# Patient Record
Sex: Male | Born: 1945 | Race: White | Hispanic: No | Marital: Married | Smoking: Former smoker
Health system: Southern US, Community
[De-identification: ages and names within clinical notes are randomized; demographics above are authoritative.]

## PROBLEM LIST (undated history)

## (undated) DIAGNOSIS — M1990 Unspecified osteoarthritis, unspecified site: Secondary | ICD-10-CM

## (undated) DIAGNOSIS — Z87442 Personal history of urinary calculi: Secondary | ICD-10-CM

## (undated) DIAGNOSIS — J449 Chronic obstructive pulmonary disease, unspecified: Secondary | ICD-10-CM

## (undated) DIAGNOSIS — K219 Gastro-esophageal reflux disease without esophagitis: Secondary | ICD-10-CM

## (undated) DIAGNOSIS — J45909 Unspecified asthma, uncomplicated: Secondary | ICD-10-CM

## (undated) DIAGNOSIS — E785 Hyperlipidemia, unspecified: Secondary | ICD-10-CM

## (undated) DIAGNOSIS — R0600 Dyspnea, unspecified: Secondary | ICD-10-CM

## (undated) HISTORY — PX: HERNIA REPAIR: SHX51

## (undated) HISTORY — DX: Hyperlipidemia, unspecified: E78.5

## (undated) HISTORY — PX: JOINT REPLACEMENT: SHX530

## (undated) HISTORY — DX: Chronic obstructive pulmonary disease, unspecified: J44.9

## (undated) HISTORY — PX: OTHER SURGICAL HISTORY: SHX169

## (undated) HISTORY — DX: Dyspnea, unspecified: R06.00

---

## 2002-01-09 ENCOUNTER — Encounter: Payer: Self-pay | Admitting: Emergency Medicine

## 2002-01-09 ENCOUNTER — Emergency Department (HOSPITAL_COMMUNITY): Admission: EM | Admit: 2002-01-09 | Discharge: 2002-01-09 | Payer: Self-pay | Admitting: Emergency Medicine

## 2004-06-25 ENCOUNTER — Inpatient Hospital Stay (HOSPITAL_COMMUNITY): Admission: RE | Admit: 2004-06-25 | Discharge: 2004-06-28 | Payer: Self-pay | Admitting: Orthopedic Surgery

## 2004-07-09 ENCOUNTER — Ambulatory Visit (HOSPITAL_COMMUNITY): Admission: RE | Admit: 2004-07-09 | Discharge: 2004-07-09 | Payer: Self-pay | Admitting: Orthopedic Surgery

## 2007-01-12 ENCOUNTER — Ambulatory Visit: Payer: Self-pay | Admitting: Pulmonary Disease

## 2007-02-24 ENCOUNTER — Telehealth: Payer: Self-pay | Admitting: Pulmonary Disease

## 2007-03-01 ENCOUNTER — Ambulatory Visit: Payer: Self-pay | Admitting: Pulmonary Disease

## 2007-03-01 DIAGNOSIS — R0602 Shortness of breath: Secondary | ICD-10-CM | POA: Insufficient documentation

## 2007-03-01 DIAGNOSIS — E785 Hyperlipidemia, unspecified: Secondary | ICD-10-CM | POA: Insufficient documentation

## 2007-04-02 ENCOUNTER — Telehealth: Payer: Self-pay | Admitting: Pulmonary Disease

## 2008-05-08 ENCOUNTER — Ambulatory Visit: Payer: Self-pay | Admitting: Adult Health

## 2008-05-08 DIAGNOSIS — J4489 Other specified chronic obstructive pulmonary disease: Secondary | ICD-10-CM | POA: Insufficient documentation

## 2008-05-08 DIAGNOSIS — J449 Chronic obstructive pulmonary disease, unspecified: Secondary | ICD-10-CM | POA: Insufficient documentation

## 2008-07-12 ENCOUNTER — Ambulatory Visit: Payer: Self-pay | Admitting: Pulmonary Disease

## 2008-07-26 ENCOUNTER — Inpatient Hospital Stay (HOSPITAL_COMMUNITY): Admission: RE | Admit: 2008-07-26 | Discharge: 2008-07-30 | Payer: Self-pay | Admitting: Orthopedic Surgery

## 2009-03-05 ENCOUNTER — Ambulatory Visit: Payer: Self-pay | Admitting: Pulmonary Disease

## 2009-06-07 ENCOUNTER — Telehealth (INDEPENDENT_AMBULATORY_CARE_PROVIDER_SITE_OTHER): Payer: Self-pay | Admitting: *Deleted

## 2009-07-11 ENCOUNTER — Ambulatory Visit: Payer: Self-pay | Admitting: Pulmonary Disease

## 2009-08-30 ENCOUNTER — Telehealth (INDEPENDENT_AMBULATORY_CARE_PROVIDER_SITE_OTHER): Payer: Self-pay | Admitting: *Deleted

## 2009-10-04 ENCOUNTER — Telehealth (INDEPENDENT_AMBULATORY_CARE_PROVIDER_SITE_OTHER): Payer: Self-pay | Admitting: *Deleted

## 2010-01-09 ENCOUNTER — Telehealth (INDEPENDENT_AMBULATORY_CARE_PROVIDER_SITE_OTHER): Payer: Self-pay | Admitting: *Deleted

## 2010-03-20 ENCOUNTER — Telehealth (INDEPENDENT_AMBULATORY_CARE_PROVIDER_SITE_OTHER): Payer: Self-pay | Admitting: *Deleted

## 2010-04-11 NOTE — Progress Notes (Signed)
Summary: prescript-LMTCB x1   Phone Note Call from Patient   Caller: Patient Call For: clance Summary of Call: pt states advair is not working for him . would like to try advair hfa. [facility] [address] Initial call taken by: Rickard Patience,  August 30, 2009 2:39 PM  Follow-up for Phone Call        Spoke with pt.  Pt states Advair is "not doing the job."  requesting to try advair hfa.  Requesting rx by tomorrow.  Pt also requesting rescue inhaler refills-not needed at this time but would like at pharm for when he needs them.  Will forward message to KC-pls advise.  Thanks! Gweneth Dimitri RN  August 30, 2009 2:48 PM   Additional Follow-up for Phone Call Additional follow up Details #1::        ok to change to advair hfa 115/21  2 puffs am and pm...rinse mouth well #1, 6 fills. Additional Follow-up by: Barbaraann Share MD,  August 31, 2009 8:52 AM    Additional Follow-up for Phone Call Additional follow up Details #2::    LMOMTCB Vernie Murders  August 31, 2009 8:57 AM  Spoke with pt and advised okay per [facility] to change to advair hfa 2 puffs am and pm rinse mouth well.  Rx was sent to pharm. Follow-up by: Vernie Murders,  August 31, 2009 12:02 PM  New/Updated Medications: ADVAIR HFA 115-21 MCG/ACT AERO (FLUTICASONE-SALMETEROL) 2 puffs every 12 hours, rinse mouth well after each use Prescriptions: ADVAIR HFA 115-21 MCG/ACT AERO (FLUTICASONE-SALMETEROL) 2 puffs every 12 hours, rinse mouth well after each use  #1 x 6   Entered by:   Vernie Murders   Authorized by:   Barbaraann Share MD   Signed by:   Vernie Murders on 08/31/2009   Method used:   Electronically to        [facility] [phone]* (retail)       [address]       [place], [place]  [address]       Ph: [phone] or [phone]       Fax: [phone]   RxID:   [id number] PROAIR HFA 108 (90 BASE) MCG/ACT  AERS (ALBUTEROL SULFATE) 2 puffs every 4-6 hours as needed  #1 x 6   Entered by:   Gweneth Dimitri RN   Authorized by:   Barbaraann Share  MD   Signed by:   Gweneth Dimitri RN on 08/30/2009   Method used:   Electronically to        [facility] [phone]* (retail)       [address]       [place], [place]  [address]       Ph: [phone] or [phone]       Fax: [phone]   RxID:   [id number]

## 2010-04-11 NOTE — Progress Notes (Signed)
Summary: change inhaler----call back at 2pm today.  Phone Note Call from Patient Call back at [facility] Phone [phone]   Caller: Patient Call For: Joseph Bates Reason for Call: Refill Medication, Talk to Nurse Summary of Call: need to change his symbicort to Advair.  Insurance will no longer pay for Symbicort.  Discussed with KC last visit. [facility] Initial call taken by: Eugene Gavia,  June 07, 2009 2:57 PM  Follow-up for Phone Call        Nothing in last ov note stating KC is ok with this change.  Will forward to [facility] - pls advise if this change will be ok.  Thanks.  Gweneth Dimitri RN  June 07, 2009 3:05 PM   Additional Follow-up for Phone Call Additional follow up Details #1::        ok to change to advair 250/50 one in am and pm let him know he has to rinse very well with this med compared to symbicort. #1, 6 fills. Additional Follow-up by: Barbaraann Share MD,  June 07, 2009 4:56 PM    Additional Follow-up for Phone Call Additional follow up Details #2::    called pt's home number, LMOMTCB to inform pt KC is ok with this change and rx has been [facility].  Gweneth Dimitri RN  June 07, 2009 5:00 PM  ATC Pt at home #.  Was informed pt doesn't get home from work until 2:30 and to call then.  Will call back. Aundra Millet Reynolds LPN  June 09, 979 10:20 AM     Additional Follow-up for Phone Call Additional follow up Details #3:: Details for Additional Follow-up Action Taken: Spoke with pt and made aware that we are going to call in rx for advair 250/50, 1 puff two times a day and rinse mouth very well after each use.  Pt verbalized understanding.  [facility] [address] Additional Follow-up by: Vernie Murders,  June 08, 2009 2:36 PM  New/Updated Medications: ADVAIR DISKUS 250-50 MCG/DOSE AEPB (FLUTICASONE-SALMETEROL) 1 puff in am and 1 puff in pm. Prescriptions: ADVAIR DISKUS 250-50 MCG/DOSE AEPB (FLUTICASONE-SALMETEROL) 1 puff in am and 1 puff in pm.  #1 x 6  Entered by:   Gweneth Dimitri RN   Authorized by:   Barbaraann Share MD   Signed by:   Arman Filter LPN on 19/14/7829   Method used:   Electronically to        [facility] [phone]* (retail)       [address]       [place], [place]  [address]       Ph: [phone] or [phone]       Fax: [phone]   RxID:   [id number]

## 2010-04-11 NOTE — Progress Notes (Signed)
Summary: prescription change  Phone Note Call from Patient Call back at Home Phone [phone]   Caller: Patient Call For: clance Summary of Call: Wants advair hfa 115-53mcg changed to dulera hfa.//[facility] [address] Initial call taken by: Darletta Moll,  October 04, 2009 1:51 PM  Follow-up for Phone Call        Called, spoke with pt.  Pt states advair hfa is "still not solving my problems" like symbicort did but states he cannot afford the symbicort.  Would like to try dulera.  [facility] [address] [place], pls advise if ok to change advair hfa to dulera.   Follow-up by: Gweneth Dimitri RN,  October 04, 2009 2:07 PM  Additional Follow-up for Phone Call Additional follow up Details #1::        ok to change to dulera 100/5  2 inhalations am and pm. Additional Follow-up by: Barbaraann Share MD,  October 04, 2009 5:07 PM    New/Updated Medications: DULERA 100-5 MCG/ACT AERO (MOMETASONE FURO-FORMOTEROL FUM) 2 puffs am and pm Prescriptions: DULERA 100-5 MCG/ACT AERO (MOMETASONE FURO-FORMOTEROL FUM) 2 puffs am and pm  #1 x 5   Entered by:   Vernie Murders   Authorized by:   Barbaraann Share MD   Signed by:   Vernie Murders on 10/04/2009   Method used:   Electronically to        [facility] [phone]* (retail)       [address]       [place], [place]  [address]       Ph: [phone] or [phone]       Fax: [phone]   RxID:   [id number]   Appended Document: prescription change Rx was sent for dulera, pt aware

## 2010-04-11 NOTE — Assessment & Plan Note (Signed)
Summary: rov for mild emphysema   Primary Provider/Referring Provider:  Deboraha Sprang   CC:  Pt is here for a f/u appt.  Pt states breathing is "slightly worse" since being on Advair.  Pt states he is just getting over a "chest cold."  .  History of Present Illness: The pt comes in today for f/u of his known mild emphysema.  He has been on symbicort in the past with good results, but then had to be changed to advair due to coverage change at his insurance company.  He feels that his breathing is not quite as good on this med, and the dry powder is somewhat bothersome at times.  He has not had any recent flareup, but is just getting over what sounds like a URI.    Medications Prior to Update: 1)  Advair Diskus 250-50 Mcg/dose Aepb (Fluticasone-Salmeterol) .Marland Kitchen.. 1 Puff in Am and 1 Puff in Pm. 2)  Proair Hfa 108 (90 Base) Mcg/act  Aers (Albuterol Sulfate) .... 2 Puffs Every 4-6 Hours As Needed  Allergies (verified): 1)  ! Aleve (Naproxen Sodium)  Review of Systems      See HPI  Vital Signs:  Patient profile:   65 year old male Height:      69 inches Weight:      244 pounds O2 Sat:      97 % on Room air Temp:     98.6 degrees F oral Pulse rate:   63 / minute BP sitting:   136 / 90  (right arm) Cuff size:   regular  Vitals Entered By: Arman Filter LPN (Jul 11, 8117 3:08 PM)  O2 Flow:  Room air CC: Pt is here for a f/u appt.  Pt states breathing is "slightly worse" since being on Advair.  Pt states he is just getting over a "chest cold."   Comments Medications reviewed with patient Arman Filter LPN  Jul 11, 1476 3:08 PM    Physical Exam  General:  ow male in nad Lungs:  clear to auscultation Heart:  rrr Extremities:  no edema noted.   Impression & Recommendations:  Problem # 1:  COPD (ICD-496) the pt is not doing as well on advair as on symbicort, but will see how things go over the next few mos due to expense.  I have asked him to stay active, and to f/u with me in one  year.  Other Orders: Est. Patient Level II (29562)  Patient Instructions: 1)  no change in meds currently 2)  let me know if you would like to consider a different maintenance inhaler. 3)  followup with me in a year.  Wil have schedulers cancel all old apptms.

## 2010-04-11 NOTE — Progress Notes (Signed)
Summary: medication issue - dulera not working  Phone Note Call from Patient Call back at [facility] [phone]   Caller: Patient Call For: clance Summary of Call: Pt has been using dulera 100-18mcg for the past 2 months, says its not working, pls advise.//[facility] [address] Initial call taken by: Darletta Moll,  January 09, 2010 2:20 PM  Follow-up for Phone Call        called spoke with patient who states that he has been having difficulty breathing when lying down and increased use of his rescue inhaler.  states that he sometimes wakes up during the night and "can't breathe" but this is helped when he takes his rescue before bedtime.  does not feel that the dulera helps his breathing, though he does admit that it helps more than the advair did.  please advise, thanks! Follow-up by: Boone Master CNA/MA,  January 09, 2010 3:45 PM  Additional Follow-up for Phone Call Additional follow up Details #1::        let pt know that dulera is a very good med, and makes me wonder if his symptoms at night are really due to his lung issue or possibly sleep apnea?  we can try to see if insurance will give preauth to let you get synbicort again, but the other option is for him to have a sleep study....we have already discussed this.  See what he wants to do.  If he wants to do the preauth thing, please get necessary paperwork Additional Follow-up by: Barbaraann Share MD,  January 09, 2010 4:38 PM    Additional Follow-up for Phone Call Additional follow up Details #2::    called spoke with patient.  advised of KC's recs as stated above.  pt stated that he does not feel this is a sleep issue as he "neglected to tell" me that this also occurs during the day - but in a supine position such as lying back in a recliner or lying back in his car during lunch.  i asked pt if he sleeps during these times, for which he denied.  pt would like to try to see if insurance will pay for the symbicort rather than do the  sleep study.  informed pt that this process may take up to 2 weeks, pt okay with this.   Boone Master CNA/MA  January 09, 2010 5:29 PM   called [facility] [address] to ask if another script for the symbicort needs to be sent in order to receive a denial from insurance and then do the PA.  per pharmacist, symbicort does not require a PA but is in a higher tier.  will cost pt $60 (she was unsure if this was a copay, or a deductible meaning that once that deductible is met the price will decrease).  pharmacist stated that pt must advair, dulera and asmanex for coverage.  will call pt to see if he is able to afford the $60.  LMOM TCB x1. Boone Master CNA/MA  January 10, 2010 9:11 AM   pt returned my call.  informed him of what the pharmacist told me.  pt stated that he can pay the $60 for the symbicort and would prefer to just go ahead and restart that rather than try another inhaler.  KC may i go ahead and send refills to pt's pharmacy? Boone Master CNA/MA  January 10, 2010 2:22 PM   Additional Follow-up for Phone Call Additional follow up Details #  3:: Details for Additional Follow-up Action Taken: bet he does fall asleep in chair and on sofa during day. ok with me to go back to symbicort. Additional Follow-up by: Barbaraann Share MD,  January 10, 2010 5:09 PM  New/Updated Medications: SYMBICORT 160-4.5 MCG/ACT AERO (BUDESONIDE-FORMOTEROL FUMARATE) 2 puffs two times a day Prescriptions: SYMBICORT 160-4.5 MCG/ACT AERO (BUDESONIDE-FORMOTEROL FUMARATE) 2 puffs two times a day  #1 x 5   Entered by:   Vernie Murders   Authorized by:   Barbaraann Share MD   Signed by:   Vernie Murders on 01/10/2010   Method used:   [facility]ronically to        [facility] [phone]* (retail)       [address]       [place], [place]  [address]       Ph: [phone] or [phone]       Fax: [phone]   RxID:   [id number]

## 2010-04-11 NOTE — Progress Notes (Signed)
Summary: symbicort  Phone Note Call from Patient Call back at Home Phone [phone]   Caller: Patient Call For: clance Reason for Call: Talk to Nurse Summary of Call: Patient calling with questions and concerns about symbicort.   Initial call taken by: Lehman Prom,  March 20, 2010 2:13 PM  Follow-up for Phone Call        called spoke with patient.  he states insurance will not pay for symbicort and wonders what can be done now.  pt states that his brother uses spiriva.  i informed pt that spiriva is not the same as symbicort and KC would have to make that decision.  pt would like to continue the symbicort if he can.  he states the discount card he was given in Nov worked well; he got the 1st for free and the 2nd with a low copay, but when he tried to refill for this month was told insurance will not pay.  per last phone note, pharmacy stated that symbicort does not need a copay but is a higher tier.  will call pharmacy again to find out what's going on.  called [facility], spoke with jennifer who states that with the new year pt's insurance is requiring a cheaper inhaler or for pt to go through a mail-order.  (cheaper inhalers include dulera, adviar hfa and diskus, qvar, alvesco and asmanex).  jennifer unable to tell me pt's copay through mail-order, pt must call customer service # on back of his card. Boone Master CNA/MA  March 20, 2010 4:31 PM   Additional Follow-up for Phone Call Additional follow up Details #1::        called spoke with patient and informed him of my findings.  pt states that he prefers not going thru a mail-order to avoid paying a 43month copay all at once.  would like to try one of the "cheaper" inhalers as stated by [facility].  per pt chart dulera, advair hfa and diskus did not work.  this leaves qvar, alvesco and asmanex.  KC please advise if pt may try one of these inhalers.  pt okay with call back tomorrow. Boone Master CNA/MA  March 20, 2010 4:55 PM        Additional Follow-up for Phone Call Additional follow up Details #2::    we can change him to dulera 100/5 2 puffs am and pm if this is a cheaper alternative for him. Follow-up by: Barbaraann Share MD,  March 20, 2010 5:08 PM  Additional Follow-up for Phone Call Additional follow up Details #3:: Details for Additional Follow-up Action Taken: sorry, [facility]  pt has tried this inhaler with no relief.  please advise. Boone Master CNA/MA  March 20, 2010 5:16 PM   there are no other choices for inhalers that are equivently to symbicort except dulera, advair.  He either needs to pay the higher copay, retry one of the alternatives, or go to a lesser medication that may not do as well.  Spoek with pt and notified of the above recs per [facility].  Pt verbalized understanding and states that he will try dulera again. Rx was sent to pharm Vernie Murders  March 21, 2010 2:03 PM  Additional Follow-up by: Barbaraann Share MD,  March 21, 2010 9:58 AM  New/Updated Medications: DULERA 100-5 MCG/ACT AERO (MOMETASONE FURO-FORMOTEROL FUM) 2 puffs two times a day Prescriptions: DULERA 100-5 MCG/ACT AERO (MOMETASONE FURO-FORMOTEROL FUM) 2 puffs two times a day  #1 x 5   Entered  by:   Vernie Murders   Authorized by:   Barbaraann Share MD   Signed by:   Vernie Murders on 03/21/2010   Method used:   [facility]ronically to        [facility] [phone]* (retail)       [address]       [place], [place]  [address]       Ph: [phone] or [phone]       Fax: [phone]   RxID:   [id number]

## 2010-06-18 LAB — URINALYSIS, ROUTINE W REFLEX MICROSCOPIC
Bilirubin Urine: NEGATIVE
Ketones, ur: NEGATIVE mg/dL
Nitrite: NEGATIVE
Specific Gravity, Urine: 1.021 (ref 1.005–1.030)
Urobilinogen, UA: 0.2 mg/dL (ref 0.0–1.0)

## 2010-06-18 LAB — DIFFERENTIAL
Basophils Relative: 1 % (ref 0–1)
Lymphocytes Relative: 19 % (ref 12–46)
Monocytes Absolute: 0.4 10*3/uL (ref 0.1–1.0)
Monocytes Relative: 6 % (ref 3–12)
Neutro Abs: 4.4 10*3/uL (ref 1.7–7.7)
Neutrophils Relative %: 70 % (ref 43–77)

## 2010-06-18 LAB — CBC
HCT: 33.7 % — ABNORMAL LOW (ref 39.0–52.0)
HCT: 34.8 % — ABNORMAL LOW (ref 39.0–52.0)
HCT: 46.4 % (ref 39.0–52.0)
Hemoglobin: 12.1 g/dL — ABNORMAL LOW (ref 13.0–17.0)
Hemoglobin: 13.2 g/dL (ref 13.0–17.0)
Hemoglobin: 15.8 g/dL (ref 13.0–17.0)
MCHC: 34 g/dL (ref 30.0–36.0)
MCV: 87.8 fL (ref 78.0–100.0)
MCV: 87.8 fL (ref 78.0–100.0)
Platelets: 163 10*3/uL (ref 150–400)
RBC: 3.84 MIL/uL — ABNORMAL LOW (ref 4.22–5.81)
RBC: 4.38 MIL/uL (ref 4.22–5.81)
RBC: 4.38 MIL/uL (ref 4.22–5.81)
RBC: 5.28 MIL/uL (ref 4.22–5.81)
RDW: 12.8 % (ref 11.5–15.5)
WBC: 6.3 10*3/uL (ref 4.0–10.5)
WBC: 6.9 10*3/uL (ref 4.0–10.5)
WBC: 7.2 10*3/uL (ref 4.0–10.5)
WBC: 8.5 10*3/uL (ref 4.0–10.5)
WBC: 9.7 10*3/uL (ref 4.0–10.5)

## 2010-06-18 LAB — TYPE AND SCREEN: Antibody Screen: NEGATIVE

## 2010-06-18 LAB — COMPREHENSIVE METABOLIC PANEL
ALT: 22 U/L (ref 0–53)
AST: 22 U/L (ref 0–37)
Albumin: 3.7 g/dL (ref 3.5–5.2)
Alkaline Phosphatase: 63 U/L (ref 39–117)
BUN: 9 mg/dL (ref 6–23)
CO2: 23 mEq/L (ref 19–32)
Calcium: 9.2 mg/dL (ref 8.4–10.5)
Chloride: 112 mEq/L (ref 96–112)
Creatinine, Ser: 1.22 mg/dL (ref 0.4–1.5)
GFR calc Af Amer: >60 mL/min (ref >60)
GFR calc non Af Amer: >60 mL/min (ref >60)
Glucose, Bld: 91 mg/dL (ref 70–99)
Potassium: 4.4 mEq/L (ref 3.5–5.1)
Sodium: 141 mEq/L (ref 135–145)
Total Bilirubin: 1.3 mg/dL — ABNORMAL HIGH (ref 0.3–1.2)
Total Protein: 6.5 g/dL (ref 6.0–8.3)

## 2010-06-18 LAB — PROTIME-INR
INR: 1 (ref 0.00–1.49)
INR: 1.2 (ref 0.00–1.49)
INR: 1.4 (ref 0.00–1.49)
INR: 1.7 — ABNORMAL HIGH (ref 0.00–1.49)
Prothrombin Time: 15.2 seconds (ref 11.6–15.2)
Prothrombin Time: 17.8 seconds — ABNORMAL HIGH (ref 11.6–15.2)
Prothrombin Time: 28.8 seconds — ABNORMAL HIGH (ref 11.6–15.2)

## 2010-06-18 LAB — BASIC METABOLIC PANEL
Calcium: 7.9 mg/dL — ABNORMAL LOW (ref 8.4–10.5)
Chloride: 103 mEq/L (ref 96–112)
Creatinine, Ser: 1.07 mg/dL (ref 0.4–1.5)
GFR calc non Af Amer: >60 mL/min (ref >60)
Potassium: 3.8 mEq/L (ref 3.5–5.1)
Sodium: 137 mEq/L (ref 135–145)

## 2010-06-18 LAB — APTT: aPTT: 28 seconds (ref 24–37)

## 2010-07-23 NOTE — Op Note (Signed)
NAME:  Joseph Bates, Joseph Bates                 ACCOUNT NO.:  [id number]   MEDICAL RECORD NO.:  [id number]          PATIENT TYPE:  INP   LOCATION:  2550                         FACILITY:  [facility]   PHYSICIAN:  Harvie Junior, M.D.   DATE OF BIRTH:  1945/05/04   DATE OF PROCEDURE:  07/26/2008  DATE OF DISCHARGE:                               OPERATIVE REPORT   PREOPERATIVE DIAGNOSIS:  End-stage degenerative joint disease, left  knee.   POSTOPERATIVE DIAGNOSIS:  End-stage degenerative joint disease, left  knee.   PROCEDURES:  1. Left total knee replacement with a Sigma system, size 5 femur, size      5 tibia, 10-mm bridging bearing, and a 38-mm all-poly patella.  2. Left computer-assisted total knee replacement.   SURGEON:  Harvie Junior, MD   ASSISTANT:  Marshia Ly, PA   ANESTHESIA:  General.   BRIEF HISTORY:  Mr. Ullom is a 65 year old male with a long history of  having significant bilateral disease of the knee.  He was treated with a  total knee replacement on the right side some years ago and had done  reasonably well with that.  Because of the continued complaints of pain  on the left side, the patient was ultimately evaluated and felt to need  a total knee replacement.  He has failed conservative care.  Because of  his young age and significant varus malalignment and rotational  alignment of the femur, we felt that computer-assisted alignment was  going to be critical and the intramedullary alignment of the femur was  going to be difficult because of the curve of the femur but was  ultimately felt that computer-assisted total knee replacement would be  necessary and he is brought to the operating room for this procedure.   PROCEDURE IN DETAIL:  The patient was brought to the operating room.  After adequate anesthesia was obtained with general anesthetic, the  patient was placed supine on the operating table.  Left leg was prepped  and draped in the usual sterile fashion.   Following this, the leg was  exsanguinated and blood pressure tourniquet inflated to 350 mmHg.  Following this, a midline incision was made and subcutaneous tissue were  dissected down to the level of the extensor mechanism.  Medial  parapatellar arthrotomy was undertaken and following this medial and  lateral menisci were removed as well as anterior and posterior cruciates  with retropatellar fat pad.  The computer assistance module was then  placed.  Two pins in the tibia and two pins in the femur.  The  registration process was undertaken and that was about 30 minutes to  surgical procedure.  Once this was completed, the long alignment was  evaluated and the tibia was then cut perpendicular to the long axis  under computer assistance.  Once that was completed, attention was  turned towards the femur where the femur was cut perpendicular to an  anatomic axis.  Once that was done, spacer blocks were put in place,  little bit tight medially.  A little medial release was undertaken at  this  point and got excellent neutral long alignment at this point.  Once  this was completed, attention was turned towards the femoral side where  the femur was sized to a size 5 and the anterior and posterior cuts were  made as well as chamfers.  Once that was completed, attention was turned  towards the box-cut which was made centrally on the femur.  Once this  was completed, attention was turned towards the posterior femur where  posterior osteophytes were removed.  Attention was turned back to the  tibia where the tibia was sized to a 5, it was drilled and keeled.  Once  this was completed, the patella was sized and cut down to a level of 14  mm.  A 38-mm paddle was chosen.  Lugs were drilled in the femur and then  the patella.  At this point, trial components were placed and the knee  was put into neutral long alignment.  It had perfect neutral long  alignment and gap balance.  At this point, the  attention was turned  towards removal of trial components and the final components were then  cemented in place, size 5 femur, size 5 tibia, 10-mm bridging bearing,  and 38-mm all-poly patella.  These were hammered into place and all  excess cement was removed with the bone cement tool.  Once this was  completed, the 10 trial was placed until the cement had hardened and the  trial was removed.  Attention was then turned to the back of the knee  where all bleeding was controlled with electrocautery as well as lateral  and medially on the knee.  Once this was completed, a medium Hemovac  drain was placed.  At this point, the medial parapatellar trial was  closed with 1 Vicryl running, the skin with 0 and 2-0 Vicryl and skin  staples.  Sterile compressive dressing was applied and the patient was  taken to the recovery room and was noted to be under satisfactory  condition.  The estimated blood loss for the procedure was less than 25  mL.      Harvie Junior, M.D.  Electronically Signed     JLG/MEDQ  D:  07/26/2008  T:  07/27/2008  Job:  [id number]

## 2010-07-26 NOTE — Op Note (Signed)
NAME:  Joseph Bates, Joseph Bates                 ACCOUNT NO.:  [id number]   MEDICAL RECORD NO.:  [id number]          PATIENT TYPE:  INP   LOCATION:  2550                         FACILITY:  [facility]   PHYSICIAN:  Deidre Ala, M.D.    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   PROCEDURES:  Right total knee arthroplasty using cemented Depuy components,  rotating platform, LCS type.   SURGEON:  1.  Charlesetta Shanks, M.D.   ASSISTANT:  Clarene Reamer, P.A.-C.   ANESTHESIA:  General endotracheal.   CULTURES:  None.   DRAINS:  Two medium Hemovac to Autovac.   ESTIMATED BLOOD LOSS:  100 mL.   REPLACED:  Without.   TOURNIQUET TIME:  One hour and 20 minutes.   PATHOLOGICAL FINDINGS AND HISTORY:  Logon presented with severe right knee  pain, walking on a crutch.  He is employed by [employer]  and has been standing on concrete.  His x-rays revealed bone on bone.  He  had some posterior knee pain.  He was obviously end-stage DJD with cystic  changes in the medial femoral condyle.  It was felt that no other treatment  would be efficacious other than a knee replacement.  He desired to proceed.  At surgery, he had severe end-stage DJD tricompartment with osteophytes and  eburnated medial bone.  We ended sizing him to a large right femur, 12.5 mm  rotating platform, a #4 tibial tray and a 38 mm all poly patellar button,  replacing 9 mm.  We got him with full extension with flexion to about 110.  No anteroposterior instability with flexion.  The patella tracked well.  We  did do a partial medial release and good ligament balance ultimately.  Overall alignment was excellent with about 4 degrees of valgus.   DESCRIPTION OF  PROCEDURE:  With adequate anesthesia obtained using  endotracheal technique, 1 g of Ancef was given IV prophylaxis and another  one at tourniquet let down.  The patient was placed in the supine position.  The right lower extremity was prepped from toes to the tourniquet in a  standard fashion.  After standard prepping and draping, Esmarch  exsanguination was used and the tourniquet was let up to 350 mmHg.  A medial  parapatellar skin incision was followed by medial parapatellar retinacular  incision.  We then everted the patella, excising the fat pad, all of the  osteophytes, the menisci and the cruciates.  We then amputated the tibial  spine.  I then brought in the tibial alignment jig in place, pacing in very  slight varus to effect knee valgus, set it and made our initial cut.  I felt  he was going to be tight and active, needed more rather than less poly, so I  cut 5 mm more.  We then sized  him to a large for the femur, placed the  anterior cutting jig in place, placed it on the intramedullary guide and  then set the C clamp at 17.5, set it, pinned it and then made our  anteroposterior cuts, fitting actually at this point a 15.  Of course  ultimately a 12.5 was a better fit.  In any case, we did make those  anteroposterior cuts.  We then placed a 4 degree valgus cutting jig in  place.  We set it and made a cut actually 2.5 mg more to accommodate the 15  in extension.  We then had some tightness medially, did a medial release on  the ligament mostly on the tibial side and then placed the 15 in flexion and  extension.  The anteroposterior Chamfer cutting and notch cutting jig was  then put in place and those cuts made.  There was no need to make a far  posterior cut.  We then exposed the proximal tibia, sized to a 4, place a  central keel broach and then drilled after drilling for the central peg.  We  then placed the trial #4 tibia in place, trialed it with a 15 with the large  femur and  felt that the knee was too tight in extension and flexion, so  sized down to a 12.5.  Had perfect extension and perfect flexion.  It was a  little bit tight laterally, so I did a slight release on the lateral side of  the tibia.  Then we perfectly balanced ligamentously with a 12.5 in full  extension and flexion with no anteroposterior give and flexion to about 110.  No varus valgus instability.  We then callipered the patella, cutting down  from a 23 to a 14 to accommodate a 9 for 38 patellar button, put the  template on, cut three holes for the patellar pegs and then articulated the  patella through a range of motion trial.  All trial components were then  removed as the knee was thoroughly jet lavaged.  We then brought the  components on the field and checked for sizing as the cement was mixed with  Zinacef in a cement gun.  The knee was then flexed and the tibia exposed.  We then cemented on the tibial component with buttering of cement all the  way to the tip of the central PEG, impacted it and removed excess cement.  We then placed the rotating platform 12.5 size 4.  We then cemented on the  femoral component, impacted it, removed excess cement, held the knee in full  extension, further impacted it, removed excess cement and held it in about  30-40 degrees of flexion while the cement cured.  We then cemented on the  patellar component, impacted it, removed excess cement and held it with a  clamp until the cement had cured.  When the cement had cured, the tourniquet  was let down.  Bleeding points were cauterized.  Additional irrigation had  been carried with jet lavage prior to this with remaining mL in the bag.  We  then placed Hemovac drains in the medial and lateral gutters and brought out  the superolateral portal.  The wound was then closed in layers with 1-0  Vicryl on the retinaculum with a #1 locking PDS oversewn, 2-0 Vicryl on the subcutaneous and skin staples.  Hemovac was  hooked back to Autovac.  A bulky  sterile compressive dressing was applied with knee immobilizer.  The  patient  had a femoral nerve block.  He was awaken and taken to the recovery room in  satisfactory condition where he will start routine analgesia and  postoperative CPM.      VEP/MEDQ  D:  06/25/2004  T:  06/25/2004  Job:  [id number]

## 2010-07-26 NOTE — Discharge Summary (Signed)
NAME:  Joseph Bates, Joseph Bates                 ACCOUNT NO.:  [id number]   MEDICAL RECORD NO.:  [id number]          PATIENT TYPE:  INP   LOCATION:  0454                         FACILITY:  [facility]   PHYSICIAN:  Harvie Junior, M.D.   DATE OF BIRTH:  [birth year]   DATE OF ADMISSION:  07/26/2008  DATE OF DISCHARGE:  07/30/2008                               DISCHARGE SUMMARY   ADMITTING DIAGNOSES:  1. End-stage degenerative joint disease, left knee.  2. Emphysema.  3. History of renal calculi.  4. Status post right total knee arthroplasty, 2006.   DISCHARGE DIAGNOSES:  1. End-stage degenerative joint disease, left knee.  2. Emphysema.  3. History of renal calculi.  4. Status post right total knee arthroplasty, 2006.   PROCEDURES IN HOSPITAL:  Left total knee arthroplasty, computer-  assisted, by Harvie Junior, MD on Jul 26, 2008.   BRIEF HISTORY:  Mr. Joseph Bates is a 65 year old male who has a long history of  left knee pain and swelling.  Standing x-rays showed bone-on-bone  arthritis of the left knee.  He had night pain and pain with ambulation.  He had no relief with exhaustive conservative treatment including  medication, modification of his activities, and injection therapy.  Based upon his clinical and radiographic findings, he was felt to be a  candidate for a left total knee arthroplasty and is admitted for this.   PERTINENT LABORATORY STUDIES:  EKG on admission showed sinus bradycardia  with occasional premature ventricular complexes, otherwise normal EKG.  Hemoglobin on admission was 15.8, hematocrit 46.4, WBC 6.3.  This was  followed on a daily basis and his hemoglobin was noted to be 11.7 with  hematocrit of 33.7 on the date of discharge.  Protime was 13.5 seconds  and INR of 1.0 and PTT of 28 on admission.  On the day of discharge on  Coumadin therapy per pharmacy, his protime was 28.8 seconds with an INR  of 2.5.  CMET on admission was within normal limits.  BMET on postop day  #1  showed an elevated glucose 129, but was otherwise unremarkable.  Urinalysis on admission showed no abnormalities.   HOSPITAL COURSE:  The patient was brought to [facility] on the  date of admission and surgery.  Preoperatively, he was given 80 mg IV  gentamicin and 1 g of Ancef.  Postop, he was given a gram of Ancef q.8 h  x24 hours per protocol.  He was started on Coumadin antithrombotic  therapy per pharmacy protocol for DVT prophylaxis.  CPM machine was used  for left knee range of motion.  PCA morphine pump was used for pain  control.  Physical therapy was ordered as well.  Foley catheter was  placed at the time of his surgery.  On postop day #1, he had moderate  knee pain.  He was taking fluids without difficulty.  His Foley catheter  was intact and draining well.  He had spiked a fever up to 101.3.  His  vital signs were stable.  His O2 sats were 99% on 2 L of oxygen.  His  hemoglobin was 13.1.  BMET was normal.  His INR was 1.2.  He was  continued on PCA morphine pump.  He got out of bed to chair and his  Foley catheter was removed.   On postop day #2, he had moderate left knee pain.  He was taking fluids  and voiding without difficulties, afebrile, and his vital signs were  stable.  His left knee dressing was changed and his Hemovac drain was  pulled.  His wound was benign.  His calf was soft.  His INR was 1.4.  His PCA morphine pump was discontinued.  His IV was converted to a  saline lock.  He was put on oral pain medications, continued on  Coumadin.   On postop day #3, he had a little difficulty voiding, but overall did  fairly well.  He had a hemoglobin of 12.1, INR of 1.7.  He was felt to  be stable, but he felt like he needed an additional day of physical  therapy.  Particularly with the history of possibility of having some  difficulty voiding, we did not want to run into a problem there at home.   On Jul 30, 2008, postop day #4, the patient was doing well and  he was  without complaints, ready to go home.  He was taking fluids and voiding  without difficulty.  He had spiked a fever up to 100.2, but then was  found to be afebrile.  He was walking in the hall without difficulty.  His INR was 2.5.  His hemoglobin was 11.7.  He was discharged to home in  improved condition.  He was given RX for Percocet 5 mg 1-2 q.6 h. p.r.n.  pain and Coumadin per pharmacy protocol for DVT prophylaxis x1 month  postop.  He is going home with home health physical therapy and home CPM  machine for range of motion of his knee.  He will have home health RN  for protimes and Coumadin management.  We will see him with Dr. Luiz Blare  in the office in 10 days, sooner should any problems.  His activity  status will be weightbearing as tolerated on the left with a walker.      Marshia Ly, P.A.      Harvie Junior, M.D.  Electronically Signed    JB/MEDQ  D:  10/09/2008  T:  10/10/2008  Job:  [id number]

## 2010-07-26 NOTE — Discharge Summary (Signed)
NAME:  Joseph Bates, Joseph Bates NO.:  [id number]   MEDICAL RECORD NO.:  [id number]          PATIENT TYPE:  INP   LOCATION:  5021                         FACILITY:  [facility]   PHYSICIAN:  Deidre Ala, M.D.    DATE OF BIRTH:  [birth year]   DATE OF ADMISSION:  06/25/2004  DATE OF DISCHARGE:  06/28/2004                                 DISCHARGE SUMMARY   ADMISSION DIAGNOSIS:  End-stage osteoarthritis of the right knee.   DISCHARGE DIAGNOSIS:  1.  End-stage osteoarthritis of the right knee, status post right total knee      arthroplasty.  2.  Postoperative hemorrhagic anemia, stable at the time of discharge.   PROCEDURE:  The patient was taken to the operating room on 06/25/04 and  underwent a right total knee arthroplasty, DePuy LCS rotating platform type.  The surgeon was Dr. Deidre Ala, assistant was Clarene Reamer, P.A.-C.  The surgery was done under general and regional anesthesia.  A Hemovac drain  x1 was placed at the time of surgery.   CONSULTS:  1.  Physical therapy.  2.  Occupational therapy.  3.  Social work case Insurance account manager.   BRIEF HISTORY:  The patient is a 65 year old male with a long standing  history of right knee pain.  He had previously failed conservative  treatment.  Radiographs in the office were reviewed and find end-stage  osteoarthritis of the right knee.  Upon these findings, Dr Renae Fickle felt it was  best to proceed with a right total knee arthroplasty.  The patient agreed.  The risks and benefits of the surgery were discussed with the  patient and  the patient wished to proceed.   LABORATORY DATA:  EKG on admission showed technically poor tracing with  normal sinus rhythm and normal EKG.   HOSPITAL COURSE:  The patient was admitted to [facility] and  underwent the above stated procedure.  The patient tolerated the procedure  well and was allowed to return to the recovery room on the orthopedic floor  to continue postoperative care.  On  postoperative day #1, the patient was  resting comfortably.  Hemoglobin and hematocrit were 14.1 and 40.5.  Maximum  temperature was 102.  She was neurovascularly intact to the right lower  extremity.  The patient worked with physical therapy and occupational  therapy.  On June 27, 2004, postoperative day #2, the patient was resting  comfortably, maximum temperature was 99.  Hemoglobin and hematocrit 13.5 and  39.8.  He is neurovascularly intact in the right lower extremity.  His  incision was clean, dry and intact.  Hemovac, PCA and Foley were  discontinued on this day.  His IV was hep locked and his dressing was  changed.  He is to continue working with physical therapy and occupational  therapy with possible discharge home the following day.  On 06/28/04,  postoperative day #3, the patient was doing well and was ready for  discharge.  Maximum temperature was 99.9.  Hemoglobin and hematocrit were  12.9 and 39.0.  He was neurovascularly intact to his right  lower extremity.  The incision was clean, dry and intact.  He will be discharged home on this  date.   DISPOSITION:  The patient was discharged home on June 28, 2004.   DISCHARGE MEDICATIONS:  1.  Percocet 5/325, 1-2 p.o. q.4-6h. p.r.n. pain.  2.  Robaxin 500 mg,  1 p.o. q.6-8h. p.r.n. spasm.  3.  Coumadin per pharmacy protocol.   DIET:  As tolerated.   ACTIVITY:  Weightbearing as tolerated to the right lower extremity.   WOUND CARE:  The patient is to perform daily dressing changes until no  drainage.  He may shower on postoperative day #5 or when no drainage.   FOLLOWUP:  The patient is to follow up with Dr. Renae Fickle two weeks from the day  of surgery.  He is to call the office for an appointment at [phone].   CONDITION ON DISCHARGE:  Stable and improved.      Pearline Cables   SW/MEDQ  D:  09/05/2004  T:  09/05/2004  Job:  [id number]

## 2010-11-12 ENCOUNTER — Telehealth: Payer: Self-pay | Admitting: Pulmonary Disease

## 2010-11-12 MED ORDER — MOMETASONE FURO-FORMOTEROL FUM 100-5 MCG/ACT IN AERO: 2.0000 | INHALATION_SPRAY | Freq: Two times a day (BID) | RESPIRATORY_TRACT | Status: DC

## 2010-11-12 NOTE — Telephone Encounter (Signed)
I spoke with pt and he states he needed his dulera 100 sent to pharmacy. I advised pt he was overdue for a f/u. Pt states he will have medicare as his insurance starting October and would like to f/u then since he does not have insurance now. Pt is scheduled 10/8 at 4:00 for f/u with kc. rx has been sent

## 2010-12-16 ENCOUNTER — Ambulatory Visit (INDEPENDENT_AMBULATORY_CARE_PROVIDER_SITE_OTHER): Payer: Medicare Other | Admitting: Pulmonary Disease

## 2010-12-16 ENCOUNTER — Encounter: Payer: Self-pay | Admitting: Pulmonary Disease

## 2010-12-16 VITALS — BP 130/82 | HR 61 | Temp 97.8°F | Ht 69.0 in | Wt 246.6 lb

## 2010-12-16 DIAGNOSIS — J449 Chronic obstructive pulmonary disease, unspecified: Secondary | ICD-10-CM

## 2010-12-16 MED ORDER — BUDESONIDE-FORMOTEROL FUMARATE 160-4.5 MCG/ACT IN AERO: 2.0000 | INHALATION_SPRAY | Freq: Two times a day (BID) | RESPIRATORY_TRACT | Status: DC

## 2010-12-16 NOTE — Patient Instructions (Signed)
Will change your maintenance inhaler to symbicort 160/4.5  2 inhalations am and pm.  Rinse mouth well.  Can use rescue inhaler as needed. Follow up with me in one year.

## 2010-12-16 NOTE — Progress Notes (Signed)
  Subjective:    Patient ID: Joseph Bates, male    DOB: [birth year], 65 y.o.   MRN: [id number]  HPI The patient comes in today for his yearly followup of mild COPD.  Overall, he feels that he has been doing fairly well on his current bronchodilator regimen, but would like to change his maintenance inhaler back to symbicort because of insurance cost.  He also felt symbicort did a better job of controlling his symptoms.  Since the last visit, he has not had an acute exacerbation or pulmonary infection.  He feels that his exertional tolerance is at baseline.   Review of Systems  Constitutional: Negative for fever and unexpected weight change.  HENT: Negative for ear pain, nosebleeds, congestion, sore throat, rhinorrhea, sneezing, trouble swallowing, dental problem, postnasal drip and sinus pressure.   Eyes: Negative for redness and itching.  Respiratory: Positive for cough and shortness of breath. Negative for chest tightness and wheezing.   Cardiovascular: Negative for palpitations and leg swelling.  Gastrointestinal: Negative for nausea and vomiting.  Genitourinary: Negative for dysuria.  Musculoskeletal: Negative for joint swelling.  Skin: Negative for rash.  Neurological: Negative for headaches.  Hematological: Does not bruise/bleed easily.  Psychiatric/Behavioral: Negative for dysphoric mood. The patient is not nervous/anxious.        Objective:   Physical Exam Obese male in nad Nose without purulence or discharge noted. Chest with clear bs, no wheezing Cor with rrr LE with mild edema, no cyanosis noted.  Alert, oriented, moves all 4        Assessment & Plan:

## 2010-12-16 NOTE — Assessment & Plan Note (Signed)
The pt has been doing well on his current regimen, but wishes to change to symbicort due to costs/insurance coverage.  He feels his exertional tolerance has been stable, and reports no acute exacerbation since last visit.  He is to get his flu shot this week.  Will f/u with me in one year.

## 2011-12-06 ENCOUNTER — Other Ambulatory Visit: Payer: Self-pay | Admitting: Pulmonary Disease

## 2011-12-11 DIAGNOSIS — Z23 Encounter for immunization: Secondary | ICD-10-CM | POA: Diagnosis not present

## 2011-12-16 ENCOUNTER — Ambulatory Visit (INDEPENDENT_AMBULATORY_CARE_PROVIDER_SITE_OTHER): Payer: Medicare Other | Admitting: Pulmonary Disease

## 2011-12-16 ENCOUNTER — Encounter: Payer: Self-pay | Admitting: Pulmonary Disease

## 2011-12-16 VITALS — BP 152/94 | HR 73 | Temp 98.3°F | Ht 69.0 in | Wt 255.0 lb

## 2011-12-16 DIAGNOSIS — J449 Chronic obstructive pulmonary disease, unspecified: Secondary | ICD-10-CM | POA: Diagnosis not present

## 2011-12-16 NOTE — Assessment & Plan Note (Signed)
The patient is doing very well on symbicort, and rarely uses his rescue inhaler.  He is satisfied with his exertional tolerance, and has not had an acute exacerbation.  I have asked him to work on some type of exercise program, as well as weight loss.

## 2011-12-16 NOTE — Patient Instructions (Addendum)
Continue on symbicort as you are doing.  Can use albuterol as needed for rescue. followup with me in one year.

## 2011-12-16 NOTE — Progress Notes (Signed)
  Subjective:    Patient ID: Joseph Bates, male    DOB: January 30, 1946, 66 y.o.   MRN: [id number]  HPI The patient comes in today for followup of his known mild COPD.  He did not see any change with his dulera, so went back on his symbicort.  He has done very well since that time, and rarely uses his rescue inhaler.  He feels that his breathing is at baseline, and has not had an acute exacerbation since last visit.   Review of Systems  Constitutional: Negative for fever and unexpected weight change.  HENT: Negative for ear pain, nosebleeds, congestion, sore throat, rhinorrhea, sneezing, trouble swallowing, dental problem, postnasal drip and sinus pressure.   Eyes: Negative for redness and itching.  Respiratory: Negative for cough, chest tightness, shortness of breath and wheezing.   Cardiovascular: Negative for palpitations and leg swelling.  Gastrointestinal: Negative for nausea and vomiting.  Genitourinary: Negative for dysuria.  Musculoskeletal: Negative for joint swelling.  Skin: Negative for rash.  Neurological: Negative for headaches.  Hematological: Does not bruise/bleed easily.  Psychiatric/Behavioral: Negative for dysphoric mood. The patient is not nervous/anxious.        Objective:   Physical Exam Overweight male in no acute distress Nose without purulence or discharge noted Chest totally clear to auscultation, no wheezing Cardiac exam with regular rate and rhythm Lower extremities with mild edema, no cyanosis Alert and oriented, moves all 4 extremities.       Assessment & Plan:

## 2012-01-09 ENCOUNTER — Other Ambulatory Visit: Payer: Self-pay | Admitting: Pulmonary Disease

## 2012-12-24 ENCOUNTER — Encounter: Payer: Self-pay | Admitting: Pulmonary Disease

## 2012-12-24 ENCOUNTER — Ambulatory Visit (INDEPENDENT_AMBULATORY_CARE_PROVIDER_SITE_OTHER): Payer: Medicare Other | Admitting: Pulmonary Disease

## 2012-12-24 VITALS — BP 152/98 | HR 64 | Temp 98.1°F | Ht 69.0 in | Wt 259.8 lb

## 2012-12-24 DIAGNOSIS — J449 Chronic obstructive pulmonary disease, unspecified: Secondary | ICD-10-CM

## 2012-12-24 MED ORDER — BUDESONIDE-FORMOTEROL FUMARATE 160-4.5 MCG/ACT IN AERO: INHALATION_SPRAY | RESPIRATORY_TRACT | Status: DC

## 2012-12-24 NOTE — Addendum Note (Signed)
Addended by: Maisie Fus on: 12/24/2012 11:41 AM   Modules accepted: Orders

## 2012-12-24 NOTE — Patient Instructions (Signed)
No change in your breathing medication for now.  Will send in prescription for symbicort, and make sure it is not requiring prior authorization Take zyrtec 10mg  (cetirizine) one at bedtime for the next 3 weeks Take omeprazole 20mg  one each am before breakfast for next 2-3 weeks Please call me in 3 weeks to let me know your progress. followup with me in one year if doing well.

## 2012-12-24 NOTE — Assessment & Plan Note (Signed)
The patient is doing well from a pulmonary standpoint, and it is unclear if his hoarseness is secondary to his inhaler.  He has been on symbicort for many years, and has not had issues until the last one year.  Since he has done so well on the medication, I would like to treat him for postnasal drip and reflux first to see if the hoarseness improves.  If it does not, then he will need to change.  I would be hesitant to take him off inhaled corticosteroids because of a possible asthmatic component to his disease, and therefore would recommend something such as alvesco and arcapta.

## 2012-12-24 NOTE — Progress Notes (Signed)
  Subjective:    Patient ID: Joseph Bates, male    DOB: 1945-06-04, 67 y.o.   MRN: [id number]  HPI The patient comes in today for followup of his known COPD, felt secondary to emphysema and possibly an asthmatic component.  He is doing very well from a breathing standpoint, and has had no acute exacerbation.  His only complaint is that of hoarseness, despite rinsing very well afterwards.  He is unsure if he has any issues with postnasal drip or reflux.   Review of Systems  Constitutional: Negative for fever and unexpected weight change.  HENT: Positive for voice change ( hoarseness x 1 yr). Negative for congestion, dental problem, ear pain, nosebleeds, postnasal drip, rhinorrhea, sinus pressure, sneezing, sore throat and trouble swallowing.   Eyes: Negative for redness and itching.  Respiratory: Negative for cough, chest tightness, shortness of breath and wheezing.   Cardiovascular: Negative for palpitations and leg swelling.  Gastrointestinal: Negative for nausea and vomiting.  Genitourinary: Negative for dysuria.  Musculoskeletal: Negative for joint swelling.  Skin: Negative for rash.  Neurological: Negative for headaches.  Hematological: Does not bruise/bleed easily.  Psychiatric/Behavioral: Negative for dysphoric mood. The patient is not nervous/anxious.        Objective:   Physical Exam Obese male in no acute distress Nose without purulence or discharge noted Oropharynx clear Neck without lymphadenopathy or thyromegaly Chest totally clear to auscultation, no wheezing Cardiac exam regular rate and rhythm Lower extremities with mild edema, no cyanosis Alert and oriented, moves all 4 extremities.       Assessment & Plan:

## 2013-01-31 ENCOUNTER — Telehealth: Payer: Self-pay | Admitting: Pulmonary Disease

## 2013-01-31 NOTE — Telephone Encounter (Signed)
Spoke with the pt and notified of recs per [facility] He verbalized understanding He states that he feels like he is improving, so will stay on current regimen He wants to know how long he should expect to stay on all of his current meds  He does not have a planned f/u  Please advise, thanks

## 2013-01-31 NOTE — Telephone Encounter (Signed)
I called and spoke with pt. He reports when he saw [facility] 12/24/12 he was giving recs. He is calling with update. He reports the hoarseness has went away some but still experiencing this but not as bad. He is still taking the zyrtec and the omperazole. He double rinses and gargle after each inhaler use. Please advise KC thanks  Allergies  Allergen Reactions  . Naproxen Sodium     REACTION: itching

## 2013-01-31 NOTE — Telephone Encounter (Signed)
msg was closed in error

## 2013-01-31 NOTE — Telephone Encounter (Signed)
Pt aware and needed nothing further 

## 2013-01-31 NOTE — Telephone Encounter (Signed)
See note above

## 2013-01-31 NOTE — Telephone Encounter (Signed)
If he is talking about meds for postnasal drip and reflux, would give this another 3-4 weeks.

## 2013-01-31 NOTE — Telephone Encounter (Signed)
If he feels he is better, can consider giving this more time.  If the pt feels it is still a significant bother, can change his inhalers around.

## 2013-03-05 DIAGNOSIS — Z23 Encounter for immunization: Secondary | ICD-10-CM | POA: Diagnosis not present

## 2013-10-04 DIAGNOSIS — R498 Other voice and resonance disorders: Secondary | ICD-10-CM | POA: Diagnosis not present

## 2013-10-04 DIAGNOSIS — J383 Other diseases of vocal cords: Secondary | ICD-10-CM | POA: Diagnosis not present

## 2013-10-18 DIAGNOSIS — J383 Other diseases of vocal cords: Secondary | ICD-10-CM | POA: Diagnosis not present

## 2013-12-13 DIAGNOSIS — R499 Unspecified voice and resonance disorder: Secondary | ICD-10-CM | POA: Diagnosis not present

## 2013-12-13 DIAGNOSIS — J383 Other diseases of vocal cords: Secondary | ICD-10-CM | POA: Diagnosis not present

## 2013-12-20 DIAGNOSIS — D141 Benign neoplasm of larynx: Secondary | ICD-10-CM | POA: Diagnosis not present

## 2013-12-20 DIAGNOSIS — B3789 Other sites of candidiasis: Secondary | ICD-10-CM | POA: Diagnosis not present

## 2013-12-20 DIAGNOSIS — J383 Other diseases of vocal cords: Secondary | ICD-10-CM | POA: Diagnosis not present

## 2013-12-26 ENCOUNTER — Ambulatory Visit (INDEPENDENT_AMBULATORY_CARE_PROVIDER_SITE_OTHER): Payer: Medicare Other | Admitting: Pulmonary Disease

## 2013-12-26 ENCOUNTER — Encounter: Payer: Self-pay | Admitting: Pulmonary Disease

## 2013-12-26 ENCOUNTER — Encounter (INDEPENDENT_AMBULATORY_CARE_PROVIDER_SITE_OTHER): Payer: Self-pay

## 2013-12-26 VITALS — BP 132/76 | HR 61 | Temp 99.3°F | Ht 69.0 in | Wt 263.0 lb

## 2013-12-26 DIAGNOSIS — J438 Other emphysema: Secondary | ICD-10-CM

## 2013-12-26 NOTE — Progress Notes (Signed)
   Subjective:    Patient ID: Joseph Bates, male    DOB: [birth year], 68 y.o.   MRN: [id number]  HPI The patient comes in today for followup of his known COPD with asthma component. He is done well from a breathing standpoint since his last visit, and has had no acute exacerbation or increased rescue inhaler use. He did ultimately see otolaryngology about his hoarseness, and was found to have some type of "white growth" on his vocal cords and epiglottis. Apparently this was not just candidiasis, and he has had surgery for this. He continues to have some dysphonia. He feels that his breathing is doing well   Review of Systems  Constitutional: Negative for fever and unexpected weight change.  HENT: Negative for congestion, dental problem, ear pain, nosebleeds, postnasal drip, rhinorrhea, sinus pressure, sneezing, sore throat and trouble swallowing.   Eyes: Negative for redness and itching.  Respiratory: Negative for cough, chest tightness, shortness of breath and wheezing.   Cardiovascular: Negative for palpitations and leg swelling.  Gastrointestinal: Negative for nausea and vomiting.  Genitourinary: Negative for dysuria.  Musculoskeletal: Negative for joint swelling.  Skin: Negative for rash.  Neurological: Negative for headaches.  Hematological: Does not bruise/bleed easily.  Psychiatric/Behavioral: Negative for dysphoric mood. The patient is not nervous/anxious.        Objective:   Physical Exam Overweight male in no acute distress Nose without purulence or discharge noted Neck without lymphadenopathy or thyromegaly Chest totally clear to auscultation Cardiac exam with regular rate and rhythm Lower extremities with mild ankle edema, no cyanosis Alert and oriented, moves all 4 extremities       Assessment & Plan:

## 2013-12-26 NOTE — Assessment & Plan Note (Signed)
The patient remained stable from a pulmonary standpoint on his current regimen. I've asked him to continue on this, work aggressively on weight loss and conditioning. I've also asked him to try using a spacer with his inhaler to see if that makes a difference to his dysphonia

## 2013-12-26 NOTE — Patient Instructions (Signed)
No change in medications.  Try using spacer for a few weeks to see if it makes a difference to your hoarseness.  Work on [employer] loss Make sure you get your flu shot. followup with me again in one year.

## 2013-12-30 ENCOUNTER — Other Ambulatory Visit: Payer: Self-pay | Admitting: Pulmonary Disease

## 2014-03-13 DIAGNOSIS — Z23 Encounter for immunization: Secondary | ICD-10-CM | POA: Diagnosis not present

## 2014-03-28 DIAGNOSIS — R499 Unspecified voice and resonance disorder: Secondary | ICD-10-CM | POA: Diagnosis not present

## 2014-03-28 DIAGNOSIS — J383 Other diseases of vocal cords: Secondary | ICD-10-CM | POA: Diagnosis not present

## 2014-05-01 DIAGNOSIS — I10 Essential (primary) hypertension: Secondary | ICD-10-CM | POA: Diagnosis not present

## 2014-05-01 DIAGNOSIS — J029 Acute pharyngitis, unspecified: Secondary | ICD-10-CM | POA: Diagnosis not present

## 2014-05-15 DIAGNOSIS — J383 Other diseases of vocal cords: Secondary | ICD-10-CM | POA: Diagnosis not present

## 2014-05-15 DIAGNOSIS — Z9889 Other specified postprocedural states: Secondary | ICD-10-CM | POA: Diagnosis not present

## 2014-05-15 DIAGNOSIS — Z87891 Personal history of nicotine dependence: Secondary | ICD-10-CM | POA: Diagnosis not present

## 2014-05-15 DIAGNOSIS — Z886 Allergy status to analgesic agent status: Secondary | ICD-10-CM | POA: Diagnosis not present

## 2014-05-30 DIAGNOSIS — J449 Chronic obstructive pulmonary disease, unspecified: Secondary | ICD-10-CM | POA: Diagnosis present

## 2014-06-22 DIAGNOSIS — R490 Dysphonia: Secondary | ICD-10-CM | POA: Diagnosis not present

## 2014-06-22 DIAGNOSIS — Z87891 Personal history of nicotine dependence: Secondary | ICD-10-CM | POA: Diagnosis not present

## 2014-06-22 DIAGNOSIS — J383 Other diseases of vocal cords: Secondary | ICD-10-CM | POA: Diagnosis not present

## 2014-06-22 DIAGNOSIS — Z6837 Body mass index (BMI) 37.0-37.9, adult: Secondary | ICD-10-CM | POA: Diagnosis not present

## 2014-06-22 DIAGNOSIS — J449 Chronic obstructive pulmonary disease, unspecified: Secondary | ICD-10-CM | POA: Diagnosis not present

## 2014-06-22 DIAGNOSIS — E669 Obesity, unspecified: Secondary | ICD-10-CM | POA: Diagnosis not present

## 2014-07-07 ENCOUNTER — Telehealth: Payer: Self-pay | Admitting: Pulmonary Disease

## 2014-07-07 MED ORDER — BUDESONIDE-FORMOTEROL FUMARATE 160-4.5 MCG/ACT IN AERO: INHALATION_SPRAY | RESPIRATORY_TRACT | Status: DC

## 2014-07-07 NOTE — Telephone Encounter (Signed)
Called and spoke to pt. Pt requesting refill of symbicort sent to pharmacy to last till f/u with [facility] in 12/2014. Rx sent to preferred pharmacy. Pt verbalized understanding and denied any further questions or concerns at this time.

## 2014-07-17 DIAGNOSIS — J384 Edema of larynx: Secondary | ICD-10-CM | POA: Diagnosis not present

## 2014-07-17 DIAGNOSIS — Z8709 Personal history of other diseases of the respiratory system: Secondary | ICD-10-CM | POA: Diagnosis not present

## 2014-07-17 DIAGNOSIS — J383 Other diseases of vocal cords: Secondary | ICD-10-CM | POA: Diagnosis not present

## 2014-08-17 ENCOUNTER — Telehealth: Payer: Self-pay | Admitting: Pulmonary Disease

## 2014-08-17 NOTE — Telephone Encounter (Signed)
Pt questioning if we sent refills of Symbicort to the [facility] in April with refills. Pt stated he had not contacted his pharmacy. Informed pt they had been sent with 6 refills. [facility] contacted [facility] and they state the rx is on hold with the refills. Nothing further needed at this time.

## 2014-09-18 DIAGNOSIS — Z8709 Personal history of other diseases of the respiratory system: Secondary | ICD-10-CM | POA: Diagnosis not present

## 2014-09-18 DIAGNOSIS — J383 Other diseases of vocal cords: Secondary | ICD-10-CM | POA: Diagnosis not present

## 2014-09-18 DIAGNOSIS — Z87891 Personal history of nicotine dependence: Secondary | ICD-10-CM | POA: Diagnosis not present

## 2014-11-20 DIAGNOSIS — R490 Dysphonia: Secondary | ICD-10-CM | POA: Diagnosis not present

## 2014-11-20 DIAGNOSIS — J383 Other diseases of vocal cords: Secondary | ICD-10-CM | POA: Diagnosis not present

## 2014-12-27 ENCOUNTER — Ambulatory Visit: Payer: Medicare Other | Admitting: Pulmonary Disease

## 2015-01-02 ENCOUNTER — Encounter: Payer: Self-pay | Admitting: Internal Medicine

## 2015-01-02 ENCOUNTER — Ambulatory Visit (INDEPENDENT_AMBULATORY_CARE_PROVIDER_SITE_OTHER): Payer: Medicare Other | Admitting: Internal Medicine

## 2015-01-02 ENCOUNTER — Ambulatory Visit (INDEPENDENT_AMBULATORY_CARE_PROVIDER_SITE_OTHER)
Admission: RE | Admit: 2015-01-02 | Discharge: 2015-01-02 | Disposition: A | Discharge status: Home / Self Care | Payer: Medicare Other | Source: Ambulatory Visit | Attending: Internal Medicine | Admitting: Internal Medicine

## 2015-01-02 VITALS — BP 120/80 | HR 60 | Ht 69.0 in | Wt 266.8 lb

## 2015-01-02 DIAGNOSIS — J449 Chronic obstructive pulmonary disease, unspecified: Secondary | ICD-10-CM

## 2015-01-02 DIAGNOSIS — Z6839 Body mass index (BMI) 39.0-39.9, adult: Secondary | ICD-10-CM | POA: Diagnosis not present

## 2015-01-02 DIAGNOSIS — Z23 Encounter for immunization: Secondary | ICD-10-CM | POA: Diagnosis not present

## 2015-01-02 DIAGNOSIS — E669 Obesity, unspecified: Secondary | ICD-10-CM

## 2015-01-02 DIAGNOSIS — I10 Essential (primary) hypertension: Secondary | ICD-10-CM | POA: Diagnosis not present

## 2015-01-02 NOTE — Patient Instructions (Addendum)
Please remember to go to the  x-ray department downstairs for your tests - we will call you with the results when they are available.    Continue symbicort 160 Take 2 puffs first thing in am and then another 2 puffs about 12 hours later.   Only use your albuterol (proair) as a rescue medication to be used if you can't catch your breath by resting or doing a relaxed purse lip breathing pattern.  - The less you use it, the better it will work when you need it. - Ok to use up to 2 puffs  every 4 hours if you must but call for immediate appointment if use goes up over your usual need - Don't leave home without it !!  (think of it like the spare tire for your car)   Work on inhaler technique:  relax and gently blow all the way out then take a nice smooth deep breath back in, triggering the inhaler at same time you start breathing in.  Hold for up to 5 seconds if you can. Blow out thru nose. Rinse and gargle with water when done.  Please schedule a follow up office visit in 6 weeks, call sooner if needed with pfts on return

## 2015-01-03 ENCOUNTER — Encounter: Payer: Self-pay | Admitting: Internal Medicine

## 2015-01-03 NOTE — Assessment & Plan Note (Signed)
Body mass index is 39.38 kg/(m^2).  No results found for: TSH   Contributing to gerd risk/ doe/reviewed the need and the process to achieve and maintain neg calorie balance > defer f/u primary care including intermittently monitoring thyroid status

## 2015-01-03 NOTE — Assessment & Plan Note (Addendum)
-   quit smoking around 2008  -  PF11/06/2006    FEV1 2.75 (85 % ) ratio 65  p no % improvement from saba with DLCO  84 % corrects to 117 % for alv volume  And ERV 32 @ wt 262    Agree by pfts and hx this is copd / very mild more of a mild asthmatic pattern than typical of copd and could well consider tapering the symbicort to am dosing only or the 80 2bid   The proper method of use, as well as anticipated side effects, of a metered-dose inhaler are discussed and demonstrated to the patient. Improved effectiveness after extensive coaching during this visit to a level of approximately  75%   I had an extended discussion with the patient reviewing all relevant studies completed to date and  lasting 15 to 20 minutes of a 25 minute visit    Each maintenance medication was reviewed in detail including most importantly the difference between maintenance and prns and under what circumstances the prns are to be triggered using an action plan format that is not reflected in the computer generated alphabetically organized AVS.    Please see instructions for details which were reviewed in writing and the patient given a copy highlighting the part that I personally wrote and discussed at today's ov.

## 2015-01-03 NOTE — Progress Notes (Signed)
Patient ID: Joseph Bates, male   DOB: Aug 17, 1945, 68 y.o.   MRN: [id number]     Brief patient profile:  34 yowm  Quit smoking 2007  with  GOLD I copd by pfts in 2008 previously followed by Dr Joseph Bates with copd/ab predominant clinically on symbicort 160 2bid   History of Present Illness  01/03/2015 1st  office visit/ Joseph Bates re: transition of care/ GOLD I copd  Chief Complaint  Patient presents with  . Follow-up    Former [place] pt following for COPD: pt states in the last 2 weeks hes had brochitis and some sinus problems. pt states within the past week it has gotten better. pt used musinex and its helped. pt still has a little cough but not much. no c/o SOB, chest tightness, or wheezing.    Really Not limited by breathing from desired activities  Though relatively sedendtary    No obvious day to day or daytime variability or assoc chronic cough or cp or chest tightness, subjective wheeze or overt sinus or hb symptoms. No unusual exp hx or h/o childhood pna/ asthma or knowledge of premature birth.  Sleeping ok without nocturnal  or early am exacerbation  of respiratory  c/o's or need for noct saba. Also denies any obvious fluctuation of symptoms with weather or environmental changes or other aggravating or alleviating factors except as outlined above   Current Medications, Allergies, Complete Past Medical History, Past Surgical History, Family History, and Social History were reviewed in [employer] record.  ROS  The following are not active complaints unless bolded sore throat, dysphagia, dental problems, itching, sneezing,  nasal congestion or excess/ purulent secretions, ear ache,   fever, chills, sweats, unintended wt loss, classically pleuritic or exertional cp, hemoptysis,  orthopnea pnd or leg swelling, presyncope, palpitations, abdominal pain, anorexia, nausea, vomiting, diarrhea  or change in bowel or bladder habits, change in stools or urine, dysuria,hematuria,  rash,  arthralgias, visual complaints, headache, numbness, weakness or ataxia or problems with walking or coordination,  change in mood/affect or memory.      Objective:   Physical Exam    amb nad    Wt Readings from Last 3 Encounters:  01/02/15 266 lb 12.8 oz (121.02 kg)  12/26/13 263 lb (119.296 kg)  12/24/12 259 lb 12.8 oz (117.845 kg)    Vital signs reviewed        HEENT: nl dentition, turbinates, and orophanx. Nl external ear canals without cough reflex   NECK :  without JVD/Nodes/TM/ nl carotid upstrokes bilaterally   LUNGS: no acc muscle use, clear to A and P bilaterally without cough on insp or exp maneuvers   CV:  RRR  no s3 or murmur or increase in P2, no edema   ABD:  soft and nontender with nl excursion in the supine position. No bruits or organomegaly, bowel sounds nl  MS:  warm without deformities, calf tenderness, cyanosis or clubbing  SKIN: warm and dry without lesions    NEURO:  alert, approp, no deficits     I personally reviewed images and agree with radiology impression as follows:  CXR:  01/02/2015 No active cardiopulmonary disease.    Assessment:

## 2015-01-22 DIAGNOSIS — J383 Other diseases of vocal cords: Secondary | ICD-10-CM | POA: Diagnosis not present

## 2015-01-22 DIAGNOSIS — R490 Dysphonia: Secondary | ICD-10-CM | POA: Diagnosis not present

## 2015-02-13 ENCOUNTER — Ambulatory Visit: Payer: No Typology Code available for payment source | Admitting: Internal Medicine

## 2015-03-07 DIAGNOSIS — J159 Unspecified bacterial pneumonia: Secondary | ICD-10-CM | POA: Diagnosis not present

## 2015-03-07 DIAGNOSIS — R0602 Shortness of breath: Secondary | ICD-10-CM | POA: Diagnosis not present

## 2015-03-16 DIAGNOSIS — J159 Unspecified bacterial pneumonia: Secondary | ICD-10-CM | POA: Diagnosis not present

## 2015-03-30 ENCOUNTER — Ambulatory Visit: Payer: No Typology Code available for payment source | Admitting: Internal Medicine

## 2015-03-30 DIAGNOSIS — J159 Unspecified bacterial pneumonia: Secondary | ICD-10-CM | POA: Diagnosis not present

## 2015-04-25 DIAGNOSIS — B3781 Candidal esophagitis: Secondary | ICD-10-CM | POA: Diagnosis not present

## 2015-04-25 DIAGNOSIS — J384 Edema of larynx: Secondary | ICD-10-CM | POA: Diagnosis not present

## 2015-04-25 DIAGNOSIS — J383 Other diseases of vocal cords: Secondary | ICD-10-CM | POA: Diagnosis not present

## 2015-04-25 DIAGNOSIS — R490 Dysphonia: Secondary | ICD-10-CM | POA: Diagnosis not present

## 2015-04-25 DIAGNOSIS — B3789 Other sites of candidiasis: Secondary | ICD-10-CM | POA: Diagnosis not present

## 2015-05-09 DIAGNOSIS — R490 Dysphonia: Secondary | ICD-10-CM | POA: Diagnosis not present

## 2015-05-22 DIAGNOSIS — R490 Dysphonia: Secondary | ICD-10-CM | POA: Diagnosis not present

## 2015-05-24 ENCOUNTER — Ambulatory Visit (INDEPENDENT_AMBULATORY_CARE_PROVIDER_SITE_OTHER): Payer: Medicare Other | Admitting: Internal Medicine

## 2015-05-24 DIAGNOSIS — J449 Chronic obstructive pulmonary disease, unspecified: Secondary | ICD-10-CM

## 2015-05-24 LAB — PULMONARY FUNCTION TEST
DL/VA % pred: 100 %
DL/VA: 4.62 ml/min/mmHg/L
DLCO COR % PRED: 81 %
DLCO COR: 26.32 ml/min/mmHg
DLCO UNC % PRED: 83 %
DLCO unc: 27.1 ml/min/mmHg
FEF 25-75 POST: 2.73 L/s
FEF 25-75 Pre: 1.9 L/sec
FEF2575-%Change-Post: 43 %
FEF2575-%PRED-PRE: 75 %
FEF2575-%Pred-Post: 108 %
FEV1-%Change-Post: 8 %
FEV1-%PRED-POST: 83 %
FEV1-%Pred-Pre: 76 %
FEV1-POST: 2.75 L
FEV1-Pre: 2.52 L
FEV1FVC-%CHANGE-POST: 4 %
FEV1FVC-%PRED-PRE: 100 %
FEV6-%Change-Post: 4 %
FEV6-%Pred-Post: 83 %
FEV6-%Pred-Pre: 80 %
FEV6-POST: 3.52 L
FEV6-Pre: 3.37 L
FEV6FVC-%Change-Post: 0 %
FEV6FVC-%PRED-POST: 105 %
FEV6FVC-%Pred-Pre: 105 %
FVC-%Change-Post: 4 %
FVC-%PRED-POST: 79 %
FVC-%PRED-PRE: 76 %
FVC-POST: 3.56 L
FVC-PRE: 3.4 L
PRE FEV1/FVC RATIO: 74 %
Post FEV1/FVC ratio: 77 %
Post FEV6/FVC ratio: 99 %
Pre FEV6/FVC Ratio: 99 %
RV % pred: 89 %
RV: 2.18 L
TLC % PRED: 85 %
TLC: 6.02 L

## 2015-05-24 NOTE — Progress Notes (Signed)
PFT done today. 05/24/2015  

## 2015-05-25 ENCOUNTER — Ambulatory Visit (INDEPENDENT_AMBULATORY_CARE_PROVIDER_SITE_OTHER): Payer: Medicare Other | Admitting: Internal Medicine

## 2015-05-25 ENCOUNTER — Encounter: Payer: Self-pay | Admitting: Internal Medicine

## 2015-05-25 VITALS — BP 112/78 | HR 62 | Ht 70.0 in | Wt 256.0 lb

## 2015-05-25 DIAGNOSIS — J45909 Unspecified asthma, uncomplicated: Secondary | ICD-10-CM | POA: Diagnosis not present

## 2015-05-25 DIAGNOSIS — J449 Chronic obstructive pulmonary disease, unspecified: Secondary | ICD-10-CM | POA: Diagnosis not present

## 2015-05-25 MED ORDER — BUDESONIDE-FORMOTEROL FUMARATE 80-4.5 MCG/ACT IN AERO: INHALATION_SPRAY | RESPIRATORY_TRACT | Status: DC

## 2015-05-25 NOTE — Patient Instructions (Addendum)
Change symbicort 80 Take 2 puffs first thing in am and then another 2 puffs about 12 hours later.   Only use your albuterol as a rescue medication to be used if you can't catch your breath by resting or doing a relaxed purse lip breathing pattern.  - The less you use it, the better it will work when you need it. - Ok to use up to 2 puffs  every 4 hours if you must but call for immediate appointment if use goes up over your usual need - Don't leave home without it !!  (think of it like the spare tire for your car)    If you are satisfied with your treatment plan,  let your primary  doctor know and he/she can either refill your medications or you can return here when your prescription runs out.     If in any way you are not 100% satisfied,  please tell us.  If 100% better, tell your friends!  Pulmonary follow up is as needed

## 2015-05-25 NOTE — Progress Notes (Signed)
Patient ID: Joseph Bates, male   DOB: 1945/09/26, 70 y.o.   MRN: [id number]     Brief patient profile:  13 yowm  Quit smoking 2007  with  GOLD I copd by pfts in 2008 previously followed by Dr Gwenette Greet with copd/ab predominant clinically on symbicort 160 2bid but with nl pfts on rx 05/25/2015 so felt to be more c/w AB    History of Present Illness  01/03/2015 1st  office visit/ Wert re: transition of care/ GOLD I copd  Chief Complaint  Patient presents with  . Follow-up    Former [place] pt following for COPD: pt states in the last 2 weeks hes had brochitis and some sinus problems. pt states within the past week it has gotten better. pt used musinex and its helped. pt still has a little cough but not much. no c/o SOB, chest tightness, or wheezing.   Really Not limited by breathing from desired activities  Though relatively sedendtary  rec Please remember to go to the  x-ray department downstairs for your tests - we will call you with the results when they are available.  Continue symbicort 160 Take 2 puffs first thing in am and then another 2 puffs about 12 hours later.  Only use your albuterol (proair)    05/25/2015  f/u ov/Wert re:   AB / no evidence of copd on pfts 05/24/15  Chief Complaint  Patient presents with  . Follow-up    PFT done yesterday-he used symbicort prior to test. Breathing is doing well today. He rarely uses albuterol.   Not limited by breathing from desired activities     No obvious day to day or daytime variability or assoc excess/ purulent sputum or mucus plugs   or cp or chest tightness, subjective wheeze or overt sinus or hb symptoms. No unusual exp hx or h/o childhood pna/ asthma or knowledge of premature birth.  Sleeping ok without nocturnal  or early am exacerbation  of respiratory  c/o's or need for noct saba. Also denies any obvious fluctuation of symptoms with weather or environmental changes or other aggravating or alleviating factors except as outlined above    Current Medications, Allergies, Complete Past Medical History, Past Surgical History, Family History, and Social History were reviewed in [employer] record.  ROS  The following are not active complaints unless bolded sore throat, dysphagia, dental problems, itching, sneezing,  nasal congestion or excess/ purulent secretions, ear ache,   fever, chills, sweats, unintended wt loss, classically pleuritic or exertional cp, hemoptysis,  orthopnea pnd or leg swelling, presyncope, palpitations, abdominal pain, anorexia, nausea, vomiting, diarrhea  or change in bowel or bladder habits, change in stools or urine, dysuria,hematuria,  rash, arthralgias, visual complaints, headache, numbness, weakness or ataxia or problems with walking or coordination,  change in mood/affect or memory.      Objective:   Physical Exam    amb nad min hoarse     05/25/2015       256   01/02/15 266 lb 12.8 oz (121.02 kg)  12/26/13 263 lb (119.296 kg)  12/24/12 259 lb 12.8 oz (117.845 kg)    Vital signs reviewed        HEENT: nl dentition, turbinates, and orophanx. Nl external ear canals without cough reflex   NECK :  without JVD/Nodes/TM/ nl carotid upstrokes bilaterally   LUNGS: no acc muscle use, clear to A and P bilaterally without cough on insp or exp maneuvers   CV:  RRR  no s3 or murmur or increase in P2, no edema   ABD:  soft and nontender with nl excursion in the supine position. No bruits or organomegaly, bowel sounds nl  MS:  warm without deformities, calf tenderness, cyanosis or clubbing  SKIN: warm and dry without lesions    NEURO:  alert, approp, no deficits         Assessment:

## 2015-05-29 DIAGNOSIS — R490 Dysphonia: Secondary | ICD-10-CM | POA: Diagnosis not present

## 2015-05-30 ENCOUNTER — Encounter: Payer: Self-pay | Admitting: Internal Medicine

## 2015-05-30 NOTE — Assessment & Plan Note (Addendum)
PFTs 01/2007 ERV 32% - PFTs 05/24/15 ERV 28%   Body mass index is 36.73    No results found for: TSH   Contributing to gerd tendency/ doe/reviewed the need and the process to achieve and maintain neg calorie balance > defer f/u primary care including intermittently monitoring thyroid status

## 2015-05-30 NOTE — Assessment & Plan Note (Addendum)
-   quit smoking around 2008  -  PF11/06/2006    FEV1 2.75 (85 % ) ratio 65  p no % improvement from saba with DLCO  84 % corrects to 117 % for alv volume  And ERV 32 @ wt 262     - PFT's  05/24/2015  FEV1 2.75 (83 % ) ratio 77  p 8 % improvement from saba p am symbicort 160  with DLCO  83 % corrects to 81 % for alv volume and ERV 28%  - 05/25/2015  extensive coaching HFA effectiveness =    75% so try reduce symbicort to 80 bid (no copd/hoarse on 160)  No longer meets criteria for copd but likely significant underlying AB  I had an extended discussion with the patient reviewing all relevant studies completed to date and  lasting 15 to 20 minutes of a 25 minute visit    Each maintenance medication was reviewed in detail including most importantly the difference between maintenance and prns and under what circumstances the prns are to be triggered using an action plan format that is not reflected in the computer generated alphabetically organized AVS.    Please see instructions for details which were reviewed in writing and the patient given a copy highlighting the part that I personally wrote and discussed at today's ov.   Given how mild his dz is he can be followed as needed here

## 2015-06-26 DIAGNOSIS — R490 Dysphonia: Secondary | ICD-10-CM | POA: Diagnosis not present

## 2015-06-30 DIAGNOSIS — H2513 Age-related nuclear cataract, bilateral: Secondary | ICD-10-CM | POA: Diagnosis not present

## 2015-07-04 DIAGNOSIS — R490 Dysphonia: Secondary | ICD-10-CM | POA: Diagnosis not present

## 2015-07-06 DIAGNOSIS — R490 Dysphonia: Secondary | ICD-10-CM | POA: Diagnosis not present

## 2015-07-24 DIAGNOSIS — L54 Erythema in diseases classified elsewhere: Secondary | ICD-10-CM | POA: Diagnosis not present

## 2015-07-24 DIAGNOSIS — R490 Dysphonia: Secondary | ICD-10-CM | POA: Diagnosis not present

## 2015-07-24 DIAGNOSIS — J383 Other diseases of vocal cords: Secondary | ICD-10-CM | POA: Diagnosis not present

## 2015-10-11 ENCOUNTER — Telehealth: Payer: Self-pay | Admitting: Internal Medicine

## 2015-10-11 NOTE — Telephone Encounter (Signed)
Called and spoke with pt and he stated that MW lowered his dose of the symbicort to 80.  He stated that he has used this for several months and he stated that he has not been doing well on this dose.  He would like to have the symbicort back to the 160.  He stated that he has not been sleeping awell since the change and has had to use his rescue inhaler more often.  MW please advise. Thanks

## 2015-10-12 MED ORDER — BUDESONIDE-FORMOTEROL FUMARATE 160-4.5 MCG/ACT IN AERO: 2.0000 | INHALATION_SPRAY | Freq: Two times a day (BID) | RESPIRATORY_TRACT | 6 refills | Status: DC

## 2015-10-12 NOTE — Telephone Encounter (Signed)
Fine with me symbicort 160 Take 2 puffs first thing in am and then another 2 puffs about 12 hours later. Be sure has f/u ov p one month on new rx

## 2015-10-12 NOTE — Telephone Encounter (Signed)
Called and spoke with pts wife and she is aware of med change and this has been sent to his pharmacy and pt will need to follow up with MW in 1 month after being on the symbicort 160.

## 2015-11-26 ENCOUNTER — Encounter: Payer: Self-pay | Admitting: Internal Medicine

## 2015-11-26 ENCOUNTER — Ambulatory Visit (INDEPENDENT_AMBULATORY_CARE_PROVIDER_SITE_OTHER): Payer: Medicare Other | Admitting: Internal Medicine

## 2015-11-26 VITALS — BP 140/84 | HR 73 | Ht 70.0 in | Wt 263.6 lb

## 2015-11-26 DIAGNOSIS — J449 Chronic obstructive pulmonary disease, unspecified: Secondary | ICD-10-CM | POA: Diagnosis not present

## 2015-11-26 DIAGNOSIS — J45909 Unspecified asthma, uncomplicated: Secondary | ICD-10-CM

## 2015-11-26 MED ORDER — MOMETASONE FURO-FORMOTEROL FUM 200-5 MCG/ACT IN AERO: 2.0000 | INHALATION_SPRAY | Freq: Two times a day (BID) | RESPIRATORY_TRACT | 0 refills | Status: DC

## 2015-11-26 MED ORDER — BUDESONIDE-FORMOTEROL FUMARATE 160-4.5 MCG/ACT IN AERO
INHALATION_SPRAY | RESPIRATORY_TRACT | 11 refills | Status: DC
Start: 2015-11-26 — End: 2015-11-26

## 2015-11-26 MED ORDER — BUDESONIDE-FORMOTEROL FUMARATE 160-4.5 MCG/ACT IN AERO: INHALATION_SPRAY | RESPIRATORY_TRACT | 11 refills | Status: AC

## 2015-11-26 MED ORDER — ALBUTEROL SULFATE HFA 108 (90 BASE) MCG/ACT IN AERS: INHALATION_SPRAY | RESPIRATORY_TRACT | 1 refills | Status: AC

## 2015-11-26 NOTE — Patient Instructions (Addendum)
Plan A = Automatic = Symbicort 160 (dulera 200) Take 2 puffs first thing in am and then another 2 puffs about 12 hours later.    Plan B = Backup Only use your albuterol /ventolin as a rescue medication to be used if you can't catch your breath by resting or doing a relaxed purse lip breathing pattern.  - The less you use it, the better it will work when you need it. - Ok to use the inhaler up to 2 puffs  every 4 hours if you must but call for appointment if use goes up over your usual need - Don't leave home without it !!  (think of it like the spare tire for your car)   Plan C = Crisis - only use your albuterol nebulizer if you first try Plan B and it fails to help > ok to use the nebulizer up to every 4 hours but if start needing it regularly call for immediate appointment    If you are satisfied with your treatment plan,  let your doctor know and he/she can either refill your medications or you can return here when your prescription runs out.     If in any way you are not 100% satisfied,  please tell us.  If 100% better, tell your friends!  Pulmonary follow up is as needed

## 2015-11-26 NOTE — Assessment & Plan Note (Signed)
-  quit smoking around 2008  -  PF11/06/2006    FEV1 2.75 (85 % ) ratio 65  p no % improvement from saba with DLCO  84 % corrects to 117 % for alv volume  And ERV 32 @ wt 262     - PFT's  05/24/2015  FEV1 2.75 (83 % ) ratio 77  p 8 % improvement from saba p am symbicort 160  with DLCO  83 % corrects to 81 % for alv volume and ERV 28%  - 05/25/2015      75% so try reduce symbicort to 80 bid (no copd/hoarse on 160) - 11/26/2015  After extensive coaching HFA effectiveness =   100%    All goals of chronic asthma control met including optimal function and elimination of symptoms with minimal need for rescue therapy on the 160 instead of the 80  Contingencies discussed in full including contacting this office immediately if not controlling the symptoms using the rule of two's.     I had an extended discussion with the patient reviewing all relevant studies completed to date and  lasting 15 to 20 minutes of a 25 minute visit    Each maintenance medication was reviewed in detail including most importantly the difference between maintenance and prns and under what circumstances the prns are to be triggered using an action plan format that is not reflected in the computer generated alphabetically organized AVS.    Please see instructions for details which were reviewed in writing and the patient given a copy highlighting the part that I personally wrote and discussed at today's ov.

## 2015-11-26 NOTE — Progress Notes (Signed)
Patient ID: Joseph Bates, male   DOB: 1945/11/22, 70 y.o.   MRN: [id number]     Brief patient profile:  58 yowm  Quit smoking 2007  with  GOLD I copd by pfts in 2008 previously followed by Dr Gwenette Greet with copd/ab predominant clinically on symbicort 160 2bid but with nl pfts on rx 05/25/2015 so felt to be more c/w AB    History of Present Illness  01/03/2015 1st  office visit/ [facility] re: transition of care/ GOLD I copd  Chief Complaint  Patient presents with  . Follow-up    Former [place] pt following for COPD: pt states in the last 2 weeks hes had brochitis and some sinus problems. pt states within the past week it has gotten better. pt used musinex and its helped. pt still has a little cough but not much. no c/o SOB, chest tightness, or wheezing.   Really Not limited by breathing from desired activities  Though relatively sedendtary  rec Please remember to go to the  x-ray department downstairs for your tests - we will call you with the results when they are available.  Continue symbicort 160 Take 2 puffs first thing in am and then another 2 puffs about 12 hours later.  Only use your albuterol (proair)    05/25/2015  f/u ov/Wert re:   AB / no evidence of copd on pfts 05/24/15  Chief Complaint  Patient presents with  . Follow-up    PFT done yesterday-he used symbicort prior to test. Breathing is doing well today. He rarely uses albuterol.   Not limited by breathing from desired activities   rec Change symbicort 80 Take 2 puffs first thing in am and then another 2 puffs about 12 hours later.  Only use your albuterol as a rescue medication    10/11/15 changed back to 160 to freq breakthru   11/26/2015  f/u ov/Wert re:  AB / maint symb 160 2bid  Chief Complaint  Patient presents with  . Follow-up    Breathing is doing well today. He rarely uses albuterol.   Not limited by breathing from desired activities  And no more use of saba now than increased the symb to 160 2bid    No obvious day to  day or daytime variability or assoc excess/ purulent sputum or mucus plugs   or cp or chest tightness, subjective wheeze or overt sinus or hb symptoms. No unusual exp hx or h/o childhood pna/ asthma or knowledge of premature birth.  Sleeping ok without nocturnal  or early am exacerbation  of respiratory  c/o's or need for noct saba. Also denies any obvious fluctuation of symptoms with weather or environmental changes or other aggravating or alleviating factors except as outlined above   Current Medications, Allergies, Complete Past Medical History, Past Surgical History, Family History, and Social History were reviewed in [employer] record.  ROS  The following are not active complaints unless bolded sore throat, dysphagia, dental problems, itching, sneezing,  nasal congestion or excess/ purulent secretions, ear ache,   fever, chills, sweats, unintended wt loss, classically pleuritic or exertional cp, hemoptysis,  orthopnea pnd or leg swelling, presyncope, palpitations, abdominal pain, anorexia, nausea, vomiting, diarrhea  or change in bowel or bladder habits, change in stools or urine, dysuria,hematuria,  rash, arthralgias, visual complaints, headache, numbness, weakness or ataxia or problems with walking or coordination,  change in mood/affect or memory.      Objective:   Physical Exam  amb nad mild voice fatigue    11/26/2015       264  05/25/2015       256   01/02/15 266 lb 12.8 oz (121.02 kg)  12/26/13 263 lb (119.296 kg)  12/24/12 259 lb 12.8 oz (117.845 kg)    Vital signs reviewed  - sats 98% RA on arrival       HEENT: nl dentition, turbinates, and orophanx. Nl external ear canals without cough reflex   NECK :  without JVD/Nodes/TM/ nl carotid upstrokes bilaterally   LUNGS: no acc muscle use, clear to A and P bilaterally without cough on insp or exp maneuvers   CV:  RRR  no s3 or murmur or increase in P2, no edema   ABD:  soft and nontender with nl  excursion in the supine position. No bruits or organomegaly, bowel sounds nl  MS:  warm without deformities, calf tenderness, cyanosis or clubbing  SKIN: warm and dry without lesions    NEURO:  alert, approp, no deficits         Assessment:

## 2015-11-26 NOTE — Assessment & Plan Note (Addendum)
PFTs 01/2007 ERV 32% - PFTs 05/24/15 ERV 28%   Low ERV is typical of obesity effects on lung volumes  Body mass index is 37.82 trending up  No results found for: TSH > says no recent PC ov > strongly suggested he make one   Contributing to gerd risk/ doe/reviewed the need and the process to achieve and maintain neg calorie balance > defer f/u primary care including intermittently monitoring thyroid status

## 2015-12-17 DIAGNOSIS — Z23 Encounter for immunization: Secondary | ICD-10-CM | POA: Diagnosis not present

## 2016-01-22 DIAGNOSIS — J383 Other diseases of vocal cords: Secondary | ICD-10-CM | POA: Diagnosis not present

## 2016-01-22 DIAGNOSIS — J384 Edema of larynx: Secondary | ICD-10-CM | POA: Diagnosis not present

## 2016-01-22 DIAGNOSIS — R490 Dysphonia: Secondary | ICD-10-CM | POA: Diagnosis not present

## 2016-06-12 DIAGNOSIS — J4540 Moderate persistent asthma, uncomplicated: Secondary | ICD-10-CM | POA: Diagnosis not present

## 2016-08-14 DIAGNOSIS — Z13220 Encounter for screening for lipoid disorders: Secondary | ICD-10-CM | POA: Diagnosis not present

## 2016-08-14 DIAGNOSIS — Z136 Encounter for screening for cardiovascular disorders: Secondary | ICD-10-CM | POA: Diagnosis not present

## 2016-08-14 DIAGNOSIS — J4540 Moderate persistent asthma, uncomplicated: Secondary | ICD-10-CM | POA: Diagnosis not present

## 2016-08-14 DIAGNOSIS — Z23 Encounter for immunization: Secondary | ICD-10-CM | POA: Diagnosis not present

## 2016-08-14 DIAGNOSIS — Z125 Encounter for screening for malignant neoplasm of prostate: Secondary | ICD-10-CM | POA: Diagnosis not present

## 2016-08-14 DIAGNOSIS — Z131 Encounter for screening for diabetes mellitus: Secondary | ICD-10-CM | POA: Diagnosis not present

## 2016-08-14 DIAGNOSIS — Z1211 Encounter for screening for malignant neoplasm of colon: Secondary | ICD-10-CM | POA: Diagnosis not present

## 2016-08-14 DIAGNOSIS — Z87898 Personal history of other specified conditions: Secondary | ICD-10-CM | POA: Diagnosis not present

## 2016-08-14 DIAGNOSIS — Z1159 Encounter for screening for other viral diseases: Secondary | ICD-10-CM | POA: Diagnosis not present

## 2016-08-14 DIAGNOSIS — L570 Actinic keratosis: Secondary | ICD-10-CM | POA: Diagnosis not present

## 2016-08-14 DIAGNOSIS — Z0000 Encounter for general adult medical examination without abnormal findings: Secondary | ICD-10-CM | POA: Diagnosis not present

## 2016-08-15 ENCOUNTER — Other Ambulatory Visit: Payer: Self-pay | Admitting: Physician Assistant

## 2016-08-15 DIAGNOSIS — Z87891 Personal history of nicotine dependence: Secondary | ICD-10-CM

## 2016-08-27 DIAGNOSIS — H2513 Age-related nuclear cataract, bilateral: Secondary | ICD-10-CM | POA: Diagnosis not present

## 2016-08-29 DIAGNOSIS — L718 Other rosacea: Secondary | ICD-10-CM | POA: Diagnosis not present

## 2016-08-29 DIAGNOSIS — D225 Melanocytic nevi of trunk: Secondary | ICD-10-CM | POA: Diagnosis not present

## 2016-09-02 ENCOUNTER — Ambulatory Visit
Admission: RE | Admit: 2016-09-02 | Discharge: 2016-09-02 | Disposition: A | Discharge status: Home / Self Care | Payer: Medicare Other | Source: Ambulatory Visit | Attending: Physician Assistant | Admitting: Physician Assistant

## 2016-09-02 DIAGNOSIS — Z136 Encounter for screening for cardiovascular disorders: Secondary | ICD-10-CM | POA: Diagnosis not present

## 2016-09-02 DIAGNOSIS — Z87891 Personal history of nicotine dependence: Secondary | ICD-10-CM

## 2016-09-25 DIAGNOSIS — K5730 Diverticulosis of large intestine without perforation or abscess without bleeding: Secondary | ICD-10-CM | POA: Diagnosis not present

## 2016-09-25 DIAGNOSIS — Z1211 Encounter for screening for malignant neoplasm of colon: Secondary | ICD-10-CM | POA: Diagnosis not present

## 2016-11-21 DIAGNOSIS — T18128A Food in esophagus causing other injury, initial encounter: Secondary | ICD-10-CM | POA: Diagnosis not present

## 2016-11-25 DIAGNOSIS — R1311 Dysphagia, oral phase: Secondary | ICD-10-CM | POA: Diagnosis not present

## 2016-12-02 DIAGNOSIS — K224 Dyskinesia of esophagus: Secondary | ICD-10-CM | POA: Diagnosis not present

## 2016-12-02 DIAGNOSIS — R1310 Dysphagia, unspecified: Secondary | ICD-10-CM | POA: Diagnosis not present

## 2016-12-02 DIAGNOSIS — K449 Diaphragmatic hernia without obstruction or gangrene: Secondary | ICD-10-CM | POA: Diagnosis not present

## 2016-12-02 DIAGNOSIS — K2980 Duodenitis without bleeding: Secondary | ICD-10-CM | POA: Diagnosis not present

## 2016-12-11 ENCOUNTER — Other Ambulatory Visit: Payer: Self-pay | Admitting: Internal Medicine

## 2016-12-15 DIAGNOSIS — Z23 Encounter for immunization: Secondary | ICD-10-CM | POA: Diagnosis not present

## 2016-12-22 ENCOUNTER — Telehealth: Payer: Self-pay | Admitting: Internal Medicine

## 2016-12-22 NOTE — Telephone Encounter (Signed)
Pt states the he is now established with a PCP and wants to see how to switch his rx's from MW to his new PCP.  I advised pt to call his PCP office and explain this situation to them, and that they should be able to switch the rx's over.  Pt expressed understanding.  Nothing further needed.

## 2017-04-07 IMAGING — DX DG CHEST 2V
2 series · 2 of 2 positions shown · non-contrast
Comparison: June 24, 2008.

CLINICAL DATA: Hypertension. Chronic obstructive pulmonary disease.

EXAM:
CHEST  2 VIEW

[chest pa]
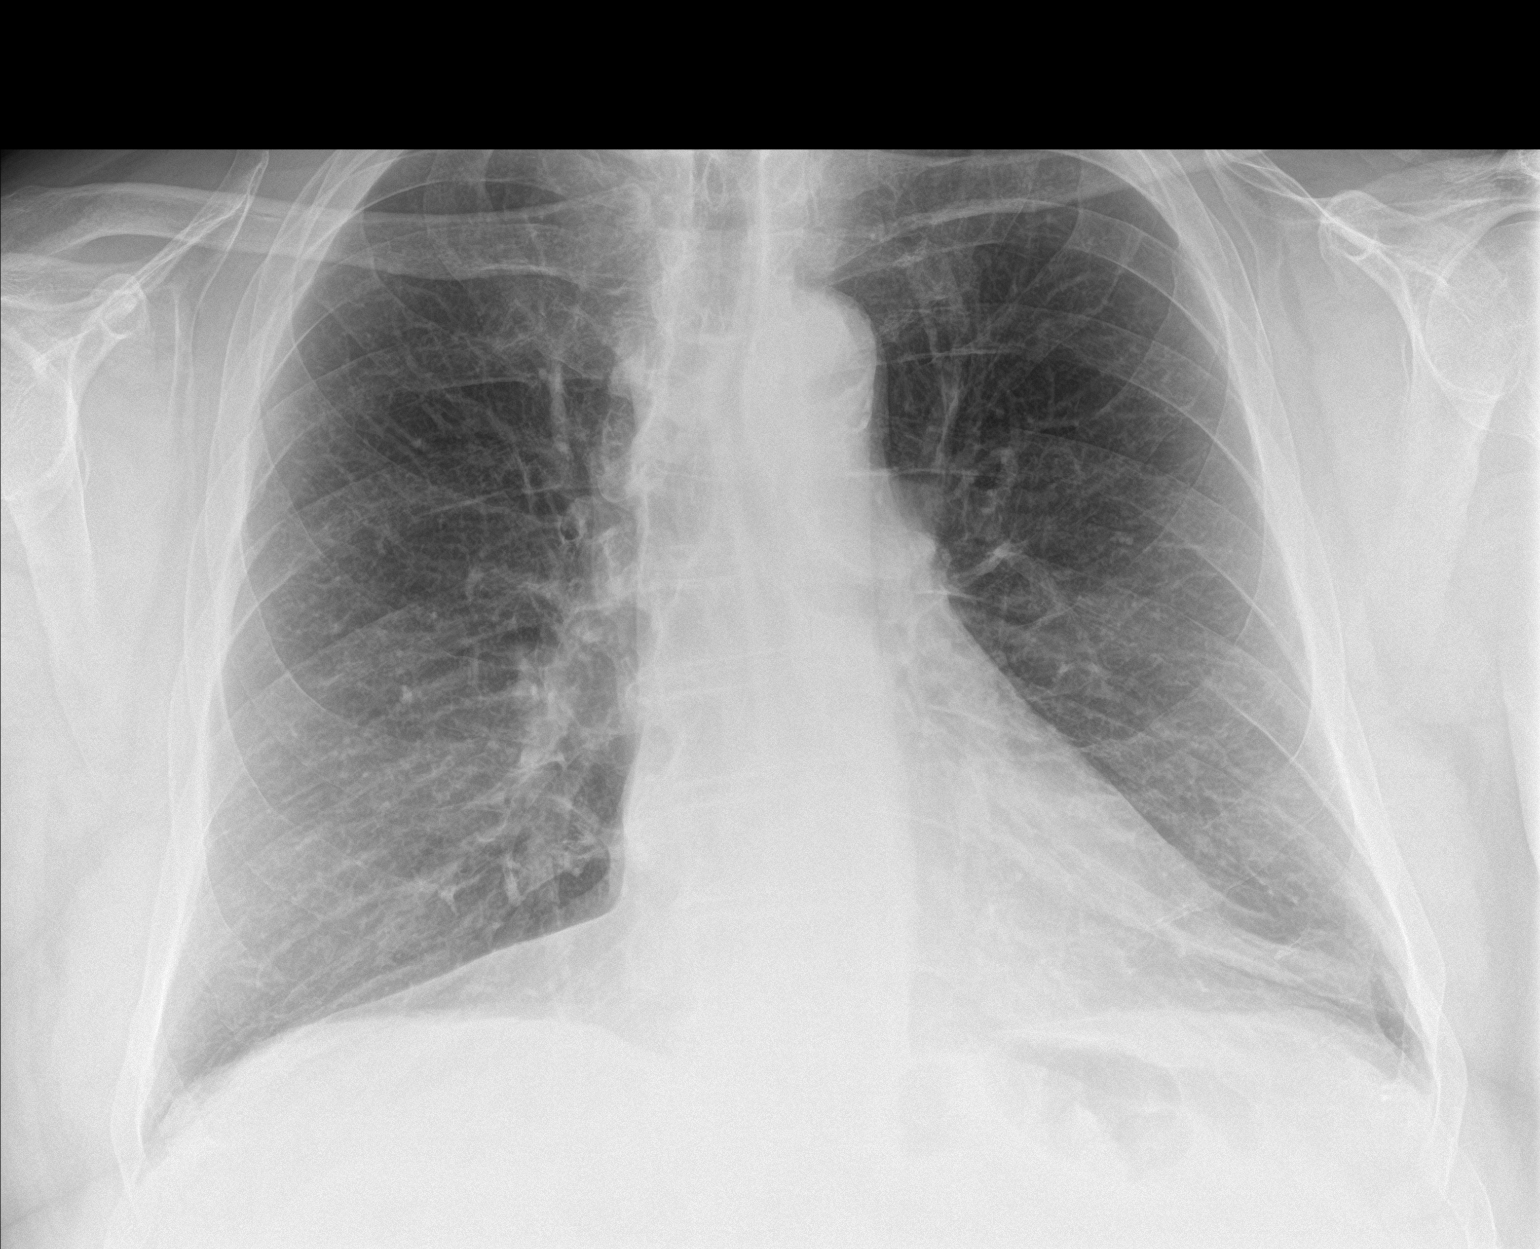

[chest lat]
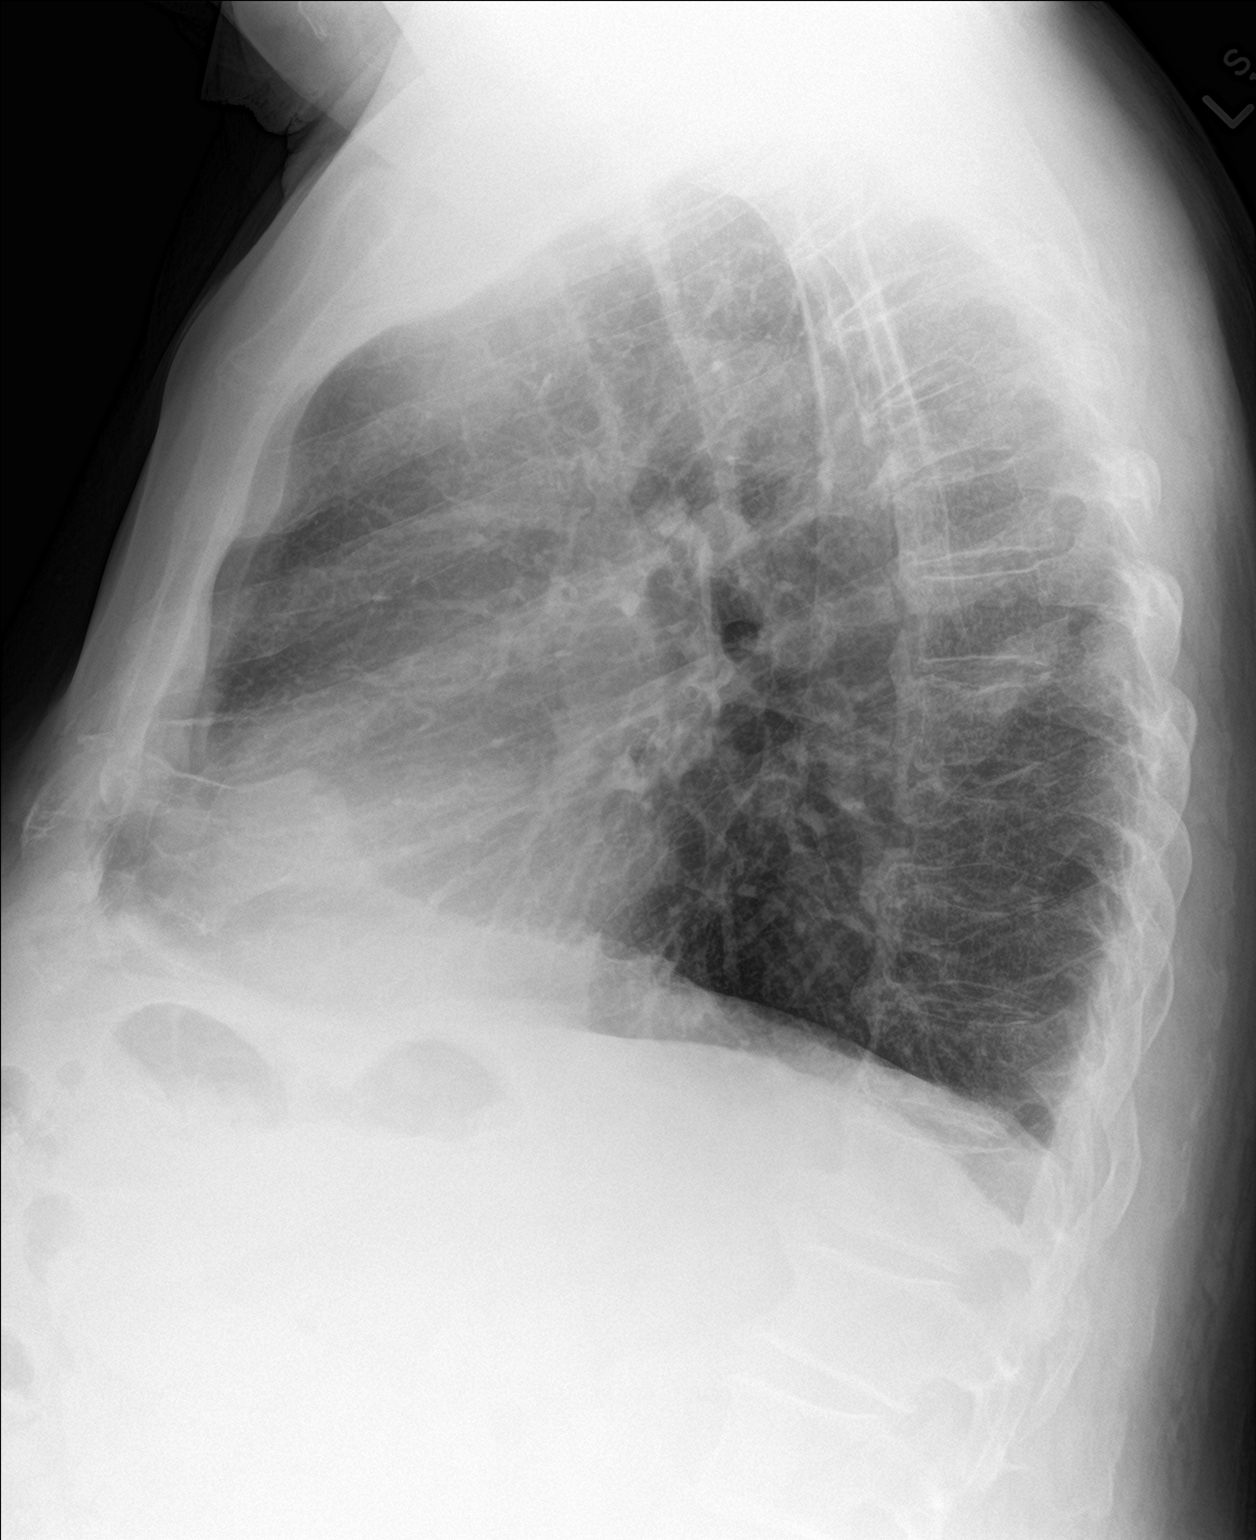

[2 of 2 positions shown; findings below may reference images not displayed]

FINDINGS: The heart size and mediastinal contours are within normal limits.
Both lungs are clear. No pneumothorax or pleural effusion is noted.
The visualized skeletal structures are unremarkable.
IMPRESSION: No active cardiopulmonary disease.

## 2017-08-04 DIAGNOSIS — J4540 Moderate persistent asthma, uncomplicated: Secondary | ICD-10-CM | POA: Diagnosis not present

## 2017-08-04 DIAGNOSIS — E7800 Pure hypercholesterolemia, unspecified: Secondary | ICD-10-CM | POA: Diagnosis not present

## 2017-08-04 DIAGNOSIS — K429 Umbilical hernia without obstruction or gangrene: Secondary | ICD-10-CM | POA: Diagnosis not present

## 2017-08-11 DIAGNOSIS — J383 Other diseases of vocal cords: Secondary | ICD-10-CM | POA: Diagnosis not present

## 2017-08-28 ENCOUNTER — Ambulatory Visit: Payer: Self-pay | Admitting: General Surgery

## 2017-08-28 DIAGNOSIS — K432 Incisional hernia without obstruction or gangrene: Secondary | ICD-10-CM | POA: Diagnosis not present

## 2017-08-28 NOTE — H&P (Signed)
History of Present Illness Joseph Ok MD; 08/28/2017 11:47 AM) The patient is a 72 year old male who presents with an incisional hernia. Referred by: Patient referred by Dr. Harrington Challenger Chief Complaint: Recurrent umbilical hernia  Patient is a 72 year old male who states he had a previous open umbilical hernia repair with mesh approximately 20 years ago. He states over the last 7 years hernia recur with a small bulge just the left side of his abdomen. Patient states that he has had minimal pain. 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 Patient has had no signs or symptoms of incarceration or strangulation. He states he is able to reduce hernia with pressure.  Patient had no previous abdominal surgeries. 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   Past Surgical History Joseph Bates; 08/28/2017 11:33 AM) Knee Surgery  Bilateral. Ventral / Umbilical Hernia Surgery  Right.  Diagnostic Studies History Joseph Bates; 08/28/2017 11:33 AM) Colonoscopy  within last year  Allergies Joseph Bates; 08/28/2017 11:36 AM) Tori Milks *ANALGESICS - ANTI-INFLAMMATORY*  Allergies Reconciled   Medication History Joseph Bates; 08/28/2017 11:35 AM) Atorvastatin Calcium (10MG  Tablet, Oral) Active. Symbicort (160-4.5MCG/ACT Aerosol, Inhalation) Active.  Social History Joseph Bates; 08/28/2017 11:33 AM) Alcohol use  Occasional alcohol use. Caffeine use  Carbonated beverages, Coffee, Tea. Illicit drug use  Remotely quit drug use. Tobacco use  Former smoker.  Family History Joseph Bates; 08/28/2017 11:33 AM) Alcohol Abuse  Brother, Father. Arthritis  Mother.  Other Problems Joseph Bates; 08/28/2017 11:33 AM) Arthritis  Asthma  Back Pain  Chronic Obstructive Lung Disease  Gastroesophageal Reflux Disease  Kidney Stone  Umbilical Hernia Repair     Review of Systems Joseph Ok MD; 08/28/2017 11:46  AM) General Not Present- Appetite Loss, Chills, Fatigue, Fever, Night Sweats, Weight Gain and Weight Loss. Skin Not Present- Change in Wart/Mole, Dryness, Hives, Jaundice, New Lesions, Non-Healing Wounds, Rash and Ulcer. HEENT Present- Wears glasses/contact lenses. Not Present- Earache, Hearing Loss, Hoarseness, Nose Bleed, Oral Ulcers, Ringing in the Ears, Seasonal Allergies, Sinus Pain, Sore Throat, Visual Disturbances and Yellow Eyes. Respiratory Present- Snoring. Not Present- Bloody sputum, Chronic Cough, Difficulty Breathing and Wheezing. Breast Not Present- Breast Mass, Breast Pain, Nipple Discharge and Skin Changes. Cardiovascular Not Present- Chest Pain, Difficulty Breathing Lying Down, Leg Cramps, Palpitations, Rapid Heart Rate, Shortness of Breath and Swelling of Extremities. Gastrointestinal Not Present- Abdominal Pain, Bloating, Bloody Stool, Change in Bowel Habits, Chronic diarrhea, Constipation, Difficulty Swallowing, Excessive gas, Gets full quickly at meals, Hemorrhoids, Indigestion, Nausea, Rectal Pain and Vomiting. Male Genitourinary Not Present- Blood in Urine, Change in Urinary Stream, Frequency, Impotence, Nocturia, Painful Urination, Urgency and Urine Leakage. Musculoskeletal Not Present- Back Pain, Joint Pain, Joint Stiffness, Muscle Pain, Muscle Weakness and Swelling of Extremities. Neurological Not Present- Decreased Memory, Fainting, Headaches, Numbness, Seizures, Tingling, Tremor, Trouble walking and Weakness. Psychiatric Not Present- Anxiety, Bipolar, Change in Sleep Pattern, Depression, Fearful and Frequent crying. Endocrine Not Present- Cold Intolerance, Excessive Hunger, Hair Changes, Heat Intolerance, Hot flashes and New Diabetes. Hematology Not Present- Blood Thinners, Easy Bruising, Excessive bleeding, Gland problems, HIV and Persistent Infections. All other systems negative  Vitals Joseph Bates; 08/28/2017 11:36 AM) 08/28/2017 11:35 AM Weight: 245.5 lb Height:  75in Body Surface Area: 2.39 m Body Mass Index: 30.69 kg/m  Temp.: 98.19F(Oral)  Pulse: 63 (Regular)  BP: 132/82 (Sitting, Left Arm, Standard)       Physical Exam Joseph Ok MD; 08/28/2017 11:47 AM) The physical exam findings are as follows: Note:Constitutional:  No acute distress, conversant, appears stated age  Eyes: Anicteric sclerae, moist conjunctiva, no lid lag  Neck: No thyromegaly, trachea midline, no cervical lymphadenopathy  Lungs: Clear to auscultation biilaterally, normal respiratory effot  Cardiovascular: regular rate & rhythm, no murmurs, no peripheal edema, pedal pulses 2+  GI: Soft, no masses or hepatosplenomegaly, non-tender to palpation  MSK: Normal gait, no clubbing cyanosis, edema  Skin: No rashes, palpation reveals normal skin turgor  Psychiatric: Appropriate judgment and insight, oriented to person, place, and time  Abdomen Inspection Hernias - Incisional - Reducible(At the umbilicus, appears hernia sac is just a left a umbilicus).    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 1. The patient will like to proceed to the operating room for laparoscopic ventral hernia repair with mesh.  2. I discussed with the patient the signs and symptoms of incarceration and strangulation and the need to proceed to the ER should they occur.  3. I discussed with the patient the risks and benefits of the procedure to include but not limited to: Infection, bleeding, damage to surrounding structures, possible need for further surgery, possible nerve pain, and possible recurrence. The patient was understanding and wishes to proceed.

## 2017-09-18 ENCOUNTER — Encounter (HOSPITAL_COMMUNITY): Payer: Self-pay

## 2017-09-18 NOTE — Pre-Procedure Instructions (Signed)
AADHAV UHLIG  09/18/2017      [id number] - [place], [address] [place] [address] Phone: [phone] Fax: [phone]    Your procedure is scheduled on July 23rd.  Report to [facility] Admitting at 1:30 P.M.  Call this number if you have problems the morning of surgery:  [phone]   Remember:  Do not eat or drink after midnight.   Continue all medications as directed by your physician except follow these medication instructions before surgery below  7 days prior to surgery STOP taking any Aspirin(unless otherwise instructed by your surgeon), Aleve, Naproxen, Ibuprofen, Motrin, Advil, Goody's, BC's, all herbal medications, fish oil, and all vitamins    Take these medicines the morning of surgery: albuterol (PROVENTIL HFA;VENTOLIN HFA) 108 (90 Base) MCG/ACT inhaler: as needed for wheezing or shortness of breath. Please bring with you to hospital.  budesonide-formoterol (SYMBICORT) 160-4.5 MCG/ACT inhaler    Do not wear jewelry, make-up or nail polish.  Do not wear lotions, powders, or perfumes, or deodorant.  Do not shave 48 hours prior to surgery.  Men may shave face and neck.  Do not bring valuables to the hospital.  [facility] is not responsible for any belongings or valuables.  Contacts, dentures or bridgework may not be worn into surgery.  Leave your suitcase in the car.  After surgery it may be brought to your room.  For patients admitted to the hospital, discharge time will be determined by your treatment team.  Patients discharged the day of surgery will not be allowed to drive home.   Special instructions:   [facility]- Preparing For Surgery  Before surgery, you can play an important role. Because skin is not sterile, your skin needs to be as free of germs as possible. You can reduce the number of germs on your skin by washing with CHG (chlorahexidine gluconate) Soap before surgery.  CHG is an antiseptic  cleaner which kills germs and bonds with the skin to continue killing germs even after washing.    Oral Hygiene is also important to reduce your risk of infection.  Remember - BRUSH YOUR TEETH THE MORNING OF SURGERY WITH YOUR REGULAR TOOTHPASTE  Please do not use if you have an allergy to CHG or antibacterial soaps. If your skin becomes reddened/irritated stop using the CHG.  Do not shave (including legs and underarms) for at least 48 hours prior to first CHG shower. It is OK to shave your face.  Please follow these instructions carefully.   1. Shower the NIGHT BEFORE SURGERY and the MORNING OF SURGERY with CHG.   2. If you chose to wash your hair, wash your hair first as usual with your normal shampoo.  3. After you shampoo, rinse your hair and body thoroughly to remove the shampoo.  4. Use CHG as you would any other liquid soap. You can apply CHG directly to the skin and wash gently with a scrungie or a clean washcloth.   5. Apply the CHG Soap to your body ONLY FROM THE NECK DOWN.  Do not use on open wounds or open sores. Avoid contact with your eyes, ears, mouth and genitals (private parts). Wash Face and genitals (private parts)  with your normal soap.  6. Wash thoroughly, paying special attention to the area where your surgery will be performed.  7. Thoroughly rinse your body with warm water from the neck down.  8. DO NOT shower/wash with your  normal soap after using and rinsing off the CHG Soap.  9. Pat yourself dry with a CLEAN TOWEL.  10. Wear CLEAN PAJAMAS to bed the night before surgery, wear comfortable clothes the morning of surgery  11. Place CLEAN SHEETS on your bed the night of your first shower and DO NOT SLEEP WITH PETS.    Day of Surgery:  Do not apply any deodorants/lotions.  Please wear clean clothes to the hospital/surgery center.   Remember to brush your teeth WITH YOUR REGULAR TOOTHPASTE.  Please read over the following fact sheets that you were  given.

## 2017-09-22 ENCOUNTER — Encounter (HOSPITAL_COMMUNITY): Payer: Self-pay

## 2017-09-22 ENCOUNTER — Other Ambulatory Visit: Payer: Self-pay

## 2017-09-22 ENCOUNTER — Encounter (HOSPITAL_COMMUNITY)
Admission: RE | Admit: 2017-09-22 | Discharge: 2017-09-22 | Disposition: A | Discharge status: Home / Self Care | Payer: Medicare Other | Source: Ambulatory Visit | Attending: General Surgery | Admitting: General Surgery

## 2017-09-22 DIAGNOSIS — K432 Incisional hernia without obstruction or gangrene: Secondary | ICD-10-CM | POA: Insufficient documentation

## 2017-09-22 DIAGNOSIS — Z01812 Encounter for preprocedural laboratory examination: Secondary | ICD-10-CM | POA: Insufficient documentation

## 2017-09-22 HISTORY — DX: Unspecified asthma, uncomplicated: J45.909

## 2017-09-22 HISTORY — DX: Unspecified osteoarthritis, unspecified site: M19.90

## 2017-09-22 HISTORY — DX: Gastro-esophageal reflux disease without esophagitis: K21.9

## 2017-09-22 HISTORY — DX: Personal history of urinary calculi: Z87.442

## 2017-09-22 LAB — BASIC METABOLIC PANEL
ANION GAP: 11 (ref 5–15)
BUN: 18 mg/dL (ref 8–23)
CALCIUM: 8.9 mg/dL (ref 8.9–10.3)
CO2: 20 mmol/L — AB (ref 22–32)
Chloride: 110 mmol/L (ref 98–111)
Creatinine, Ser: 1.39 mg/dL — ABNORMAL HIGH (ref 0.61–1.24)
GFR, EST AFRICAN AMERICAN: 57 mL/min — AB (ref >60)
GFR, EST NON AFRICAN AMERICAN: 49 mL/min — AB (ref >60)
Glucose, Bld: 110 mg/dL — ABNORMAL HIGH (ref 70–99)
Potassium: 4.1 mmol/L (ref 3.5–5.1)
SODIUM: 141 mmol/L (ref 135–145)

## 2017-09-22 LAB — CBC
HCT: 53.2 % — ABNORMAL HIGH (ref 39.0–52.0)
HEMOGLOBIN: 16.3 g/dL (ref 13.0–17.0)
MCH: 27.9 pg (ref 26.0–34.0)
MCHC: 30.6 g/dL (ref 30.0–36.0)
MCV: 90.9 fL (ref 78.0–100.0)
Platelets: 160 10*3/uL (ref 150–400)
RBC: 5.85 MIL/uL — AB (ref 4.22–5.81)
RDW: 13.4 % (ref 11.5–15.5)
WBC: 6.4 10*3/uL (ref 4.0–10.5)

## 2017-09-22 NOTE — Progress Notes (Signed)
PCP  Lawerance Cruel MD   Pt. Denies any cardiac problems,has never seen a cardiologist or had any cardiac testing

## 2017-09-29 ENCOUNTER — Ambulatory Visit (HOSPITAL_COMMUNITY)
Admission: RE | Admit: 2017-09-29 | Discharge: 2017-09-29 | Disposition: A | Discharge status: Home / Self Care | Payer: Medicare Other | Source: Ambulatory Visit | Attending: General Surgery | Admitting: General Surgery

## 2017-09-29 ENCOUNTER — Encounter (HOSPITAL_COMMUNITY): Payer: Self-pay | Admitting: *Deleted

## 2017-09-29 ENCOUNTER — Ambulatory Visit (HOSPITAL_COMMUNITY): Payer: Medicare Other | Admitting: Certified Registered"

## 2017-09-29 ENCOUNTER — Encounter (HOSPITAL_COMMUNITY)
Admission: RE | Disposition: A | Discharge status: Home / Self Care | Payer: Self-pay | Source: Ambulatory Visit | Attending: General Surgery

## 2017-09-29 ENCOUNTER — Other Ambulatory Visit: Payer: Self-pay

## 2017-09-29 DIAGNOSIS — K430 Incisional hernia with obstruction, without gangrene: Secondary | ICD-10-CM | POA: Diagnosis not present

## 2017-09-29 DIAGNOSIS — K420 Umbilical hernia with obstruction, without gangrene: Secondary | ICD-10-CM | POA: Diagnosis not present

## 2017-09-29 DIAGNOSIS — J449 Chronic obstructive pulmonary disease, unspecified: Secondary | ICD-10-CM | POA: Diagnosis not present

## 2017-09-29 DIAGNOSIS — E785 Hyperlipidemia, unspecified: Secondary | ICD-10-CM | POA: Diagnosis not present

## 2017-09-29 DIAGNOSIS — Z87891 Personal history of nicotine dependence: Secondary | ICD-10-CM | POA: Insufficient documentation

## 2017-09-29 HISTORY — PX: UMBILICAL HERNIA REPAIR: SHX196

## 2017-09-29 HISTORY — PX: INSERTION OF MESH: SHX5868

## 2017-09-29 SURGERY — REPAIR, HERNIA, UMBILICAL, LAPAROSCOPIC
Anesthesia: General | Site: Abdomen

## 2017-09-29 MED ORDER — FENTANYL CITRATE (PF) 100 MCG/2ML IJ SOLN
INTRAMUSCULAR | Status: DC | PRN
  Administered 2017-09-29: 50 ug via INTRAVENOUS
  Administered 2017-09-29: 100 ug via INTRAVENOUS
  Administered 2017-09-29: 50 ug via INTRAVENOUS

## 2017-09-29 MED ORDER — TRAMADOL HCL 50 MG PO TABS: 50.0000 mg | ORAL_TABLET | Freq: Four times a day (QID) | ORAL | 0 refills | Status: AC | PRN

## 2017-09-29 MED ORDER — MIDAZOLAM HCL 2 MG/2ML IJ SOLN
INTRAMUSCULAR | Status: DC | PRN
  Administered 2017-09-29: 2 mg via INTRAVENOUS

## 2017-09-29 MED ORDER — ROCURONIUM BROMIDE 10 MG/ML (PF) SYRINGE
PREFILLED_SYRINGE | INTRAVENOUS | Status: AC
  Filled 2017-09-29: qty 30

## 2017-09-29 MED ORDER — FENTANYL CITRATE (PF) 250 MCG/5ML IJ SOLN
INTRAMUSCULAR | Status: AC
  Filled 2017-09-29: qty 5

## 2017-09-29 MED ORDER — BUPIVACAINE HCL 0.25 % IJ SOLN
INTRAMUSCULAR | Status: DC | PRN
  Administered 2017-09-29: 6 mL

## 2017-09-29 MED ORDER — SUCCINYLCHOLINE CHLORIDE 200 MG/10ML IV SOSY
PREFILLED_SYRINGE | INTRAVENOUS | Status: AC
  Filled 2017-09-29: qty 10

## 2017-09-29 MED ORDER — CEFAZOLIN SODIUM-DEXTROSE 2-4 GM/100ML-% IV SOLN
2.0000 g | INTRAVENOUS | Status: AC
  Administered 2017-09-29: 2 g via INTRAVENOUS
  Filled 2017-09-29: qty 100

## 2017-09-29 MED ORDER — SUGAMMADEX SODIUM 200 MG/2ML IV SOLN
INTRAVENOUS | Status: DC | PRN
  Administered 2017-09-29: 200 mg via INTRAVENOUS

## 2017-09-29 MED ORDER — ROCURONIUM BROMIDE 10 MG/ML (PF) SYRINGE
PREFILLED_SYRINGE | INTRAVENOUS | Status: DC | PRN
  Administered 2017-09-29: 40 mg via INTRAVENOUS

## 2017-09-29 MED ORDER — BUPIVACAINE HCL (PF) 0.25 % IJ SOLN
INTRAMUSCULAR | Status: AC
  Filled 2017-09-29: qty 30

## 2017-09-29 MED ORDER — CELECOXIB 200 MG PO CAPS
200.0000 mg | ORAL_CAPSULE | ORAL | Status: AC
  Administered 2017-09-29: 200 mg via ORAL
  Filled 2017-09-29: qty 1

## 2017-09-29 MED ORDER — PROPOFOL 10 MG/ML IV BOLUS
INTRAVENOUS | Status: DC | PRN
  Administered 2017-09-29 (×2): 50 mg via INTRAVENOUS
  Administered 2017-09-29: 100 mg via INTRAVENOUS

## 2017-09-29 MED ORDER — SCOPOLAMINE 1 MG/3DAYS TD PT72
MEDICATED_PATCH | TRANSDERMAL | Status: AC
  Filled 2017-09-29: qty 1

## 2017-09-29 MED ORDER — PROPOFOL 10 MG/ML IV BOLUS
INTRAVENOUS | Status: AC
  Filled 2017-09-29: qty 20

## 2017-09-29 MED ORDER — GABAPENTIN 300 MG PO CAPS
300.0000 mg | ORAL_CAPSULE | ORAL | Status: AC
  Administered 2017-09-29: 300 mg via ORAL
  Filled 2017-09-29: qty 1

## 2017-09-29 MED ORDER — DIPHENHYDRAMINE HCL 50 MG/ML IJ SOLN
INTRAMUSCULAR | Status: AC
  Filled 2017-09-29: qty 1

## 2017-09-29 MED ORDER — LACTATED RINGERS IV SOLN
INTRAVENOUS | Status: DC
  Administered 2017-09-29: 14:00:00 via INTRAVENOUS

## 2017-09-29 MED ORDER — PHENYLEPHRINE 40 MCG/ML (10ML) SYRINGE FOR IV PUSH (FOR BLOOD PRESSURE SUPPORT)
PREFILLED_SYRINGE | INTRAVENOUS | Status: AC
  Filled 2017-09-29: qty 10

## 2017-09-29 MED ORDER — LIDOCAINE 2% (20 MG/ML) 5 ML SYRINGE
INTRAMUSCULAR | Status: DC | PRN
  Administered 2017-09-29: 60 mg via INTRAVENOUS

## 2017-09-29 MED ORDER — PHENYLEPHRINE HCL 10 MG/ML IJ SOLN
INTRAMUSCULAR | Status: AC
  Filled 2017-09-29: qty 1

## 2017-09-29 MED ORDER — ONDANSETRON HCL 4 MG/2ML IJ SOLN
INTRAMUSCULAR | Status: AC
  Filled 2017-09-29: qty 4

## 2017-09-29 MED ORDER — FENTANYL CITRATE (PF) 100 MCG/2ML IJ SOLN
25.0000 ug | INTRAMUSCULAR | Status: DC | PRN
  Administered 2017-09-29: 50 ug via INTRAVENOUS

## 2017-09-29 MED ORDER — 0.9 % SODIUM CHLORIDE (POUR BTL) OPTIME
TOPICAL | Status: DC | PRN
  Administered 2017-09-29: 1000 mL

## 2017-09-29 MED ORDER — CHLORHEXIDINE GLUCONATE CLOTH 2 % EX PADS: 6.0000 | MEDICATED_PAD | Freq: Once | CUTANEOUS | Status: DC

## 2017-09-29 MED ORDER — DEXAMETHASONE SODIUM PHOSPHATE 10 MG/ML IJ SOLN
INTRAMUSCULAR | Status: AC
  Filled 2017-09-29: qty 2

## 2017-09-29 MED ORDER — ONDANSETRON HCL 4 MG/2ML IJ SOLN
INTRAMUSCULAR | Status: DC | PRN
  Administered 2017-09-29: 4 mg via INTRAVENOUS

## 2017-09-29 MED ORDER — PHENYLEPHRINE 40 MCG/ML (10ML) SYRINGE FOR IV PUSH (FOR BLOOD PRESSURE SUPPORT)
PREFILLED_SYRINGE | INTRAVENOUS | Status: DC | PRN
  Administered 2017-09-29: 80 ug via INTRAVENOUS

## 2017-09-29 MED ORDER — MEPERIDINE HCL 50 MG/ML IJ SOLN: 6.2500 mg | INTRAMUSCULAR | Status: DC | PRN

## 2017-09-29 MED ORDER — FENTANYL CITRATE (PF) 100 MCG/2ML IJ SOLN
INTRAMUSCULAR | Status: AC
  Filled 2017-09-29: qty 2

## 2017-09-29 MED ORDER — MIDAZOLAM HCL 2 MG/2ML IJ SOLN
INTRAMUSCULAR | Status: AC
  Filled 2017-09-29: qty 2

## 2017-09-29 MED ORDER — ACETAMINOPHEN 500 MG PO TABS
1000.0000 mg | ORAL_TABLET | ORAL | Status: AC
  Administered 2017-09-29: 1000 mg via ORAL
  Filled 2017-09-29: qty 2

## 2017-09-29 MED ORDER — METOCLOPRAMIDE HCL 5 MG/ML IJ SOLN: 10.0000 mg | Freq: Once | INTRAMUSCULAR | Status: DC | PRN

## 2017-09-29 MED ORDER — EPHEDRINE SULFATE 50 MG/ML IJ SOLN
INTRAMUSCULAR | Status: AC
  Filled 2017-09-29: qty 1

## 2017-09-29 MED ORDER — SODIUM CHLORIDE 0.9 % IJ SOLN
INTRAMUSCULAR | Status: AC
  Filled 2017-09-29: qty 10

## 2017-09-29 SURGICAL SUPPLY — 44 items
APPLIER CLIP LOGIC TI 5 (MISCELLANEOUS) IMPLANT
APPLIER CLIP ROT 10 11.4 M/L (STAPLE)
BLADE CLIPPER SURG (BLADE) ×3 IMPLANT
CANISTER SUCT 3000ML PPV (MISCELLANEOUS) IMPLANT
CHLORAPREP W/TINT 26ML (MISCELLANEOUS) ×3 IMPLANT
CLIP APPLIE ROT 10 11.4 M/L (STAPLE) IMPLANT
COVER SURGICAL LIGHT HANDLE (MISCELLANEOUS) ×3 IMPLANT
DEFOGGER SCOPE WARMER CLEARIFY (MISCELLANEOUS) ×3 IMPLANT
DERMABOND ADVANCED (GAUZE/BANDAGES/DRESSINGS) ×2
DERMABOND ADVANCED .7 DNX12 (GAUZE/BANDAGES/DRESSINGS) ×1 IMPLANT
DEVICE SECURE STRAP 25 ABSORB (INSTRUMENTS) ×6 IMPLANT
DEVICE TROCAR PUNCTURE CLOSURE (ENDOMECHANICALS) ×3 IMPLANT
ELECT REM PT RETURN 9FT ADLT (ELECTROSURGICAL) ×3
ELECTRODE REM PT RTRN 9FT ADLT (ELECTROSURGICAL) ×1 IMPLANT
GLOVE BIO SURGEON STRL SZ7.5 (GLOVE) ×3 IMPLANT
GOWN STRL REUS W/ TWL LRG LVL3 (GOWN DISPOSABLE) ×2 IMPLANT
GOWN STRL REUS W/ TWL XL LVL3 (GOWN DISPOSABLE) ×1 IMPLANT
GOWN STRL REUS W/TWL LRG LVL3 (GOWN DISPOSABLE) ×4
GOWN STRL REUS W/TWL XL LVL3 (GOWN DISPOSABLE) ×2
GRASPER SUT TROCAR 14GX15 (MISCELLANEOUS) ×3 IMPLANT
KIT BASIN OR (CUSTOM PROCEDURE TRAY) ×3 IMPLANT
KIT TURNOVER KIT B (KITS) ×3 IMPLANT
MARKER SKIN DUAL TIP RULER LAB (MISCELLANEOUS) ×3 IMPLANT
MESH VENTRALIGHT ST 6IN CRC (Mesh General) ×3 IMPLANT
NEEDLE INSUFFLATION 14GA 120MM (NEEDLE) ×3 IMPLANT
NEEDLE SPNL 22GX3.5 QUINCKE BK (NEEDLE) IMPLANT
NS IRRIG 1000ML POUR BTL (IV SOLUTION) ×3 IMPLANT
PAD ARMBOARD 7.5X6 YLW CONV (MISCELLANEOUS) ×6 IMPLANT
SCISSORS LAP 5X35 DISP (ENDOMECHANICALS) ×3 IMPLANT
SET IRRIG TUBING LAPAROSCOPIC (IRRIGATION / IRRIGATOR) IMPLANT
SLEEVE ENDOPATH XCEL 5M (ENDOMECHANICALS) ×3 IMPLANT
SUT CHROMIC 2 0 SH (SUTURE) ×3 IMPLANT
SUT ETHIBOND 0 MO6 C/R (SUTURE) IMPLANT
SUT MNCRL AB 4-0 PS2 18 (SUTURE) ×3 IMPLANT
SUT NOVA 1 T20/GS 25DT (SUTURE) IMPLANT
SUT PROLENE 2 0 KS (SUTURE) ×12 IMPLANT
TOWEL OR 17X24 6PK STRL BLUE (TOWEL DISPOSABLE) ×3 IMPLANT
TOWEL OR 17X26 10 PK STRL BLUE (TOWEL DISPOSABLE) ×3 IMPLANT
TRAY LAPAROSCOPIC MC (CUSTOM PROCEDURE TRAY) ×3 IMPLANT
TROCAR XCEL BLUNT TIP 100MML (ENDOMECHANICALS) IMPLANT
TROCAR XCEL NON-BLD 11X100MML (ENDOMECHANICALS) IMPLANT
TROCAR XCEL NON-BLD 5MMX100MML (ENDOMECHANICALS) ×3 IMPLANT
TUBING INSUFFLATION (TUBING) ×3 IMPLANT
WATER STERILE IRR 1000ML POUR (IV SOLUTION) ×3 IMPLANT

## 2017-09-29 NOTE — Transfer of Care (Signed)
Immediate Anesthesia Transfer of Care Note  Patient: Joseph Bates  Procedure(s) Performed: LAPAROSCOPIC UMBILICAL HERNIA WITH MESH (N/A Abdomen) INSERTION OF MESH (N/A Abdomen)  Patient Location: PACU  Anesthesia Type:General  Level of Consciousness: awake and oriented  Airway & Oxygen Therapy: Patient Spontanous Breathing  Post-op Assessment: Report given to RN  Post vital signs: Reviewed and stable  Last Vitals:  Vitals Value Taken Time  BP    Temp 36.4 C 09/29/2017  6:21 PM  Pulse 62 09/29/2017  6:21 PM  Resp 12 09/29/2017  6:21 PM  SpO2 94 % 09/29/2017  6:21 PM  Vitals shown include unvalidated device data.  Last Pain:  Vitals:   09/29/17 1328  TempSrc:   PainSc: 0-No pain      Patients Stated Pain Goal: 4 (25/18/98 4210)  Complications: No apparent anesthesia complications

## 2017-09-29 NOTE — Discharge Instructions (Signed)
[facility] ° °HERNIA REPAIR: POST OP INSTRUCTIONS ° °Always review your discharge instruction sheet given to you by the facility where your surgery was performed. °IF YOU HAVE DISABILITY OR FAMILY LEAVE FORMS, YOU MUST BRING THEM TO THE OFFICE FOR PROCESSING.   °DO NOT GIVE THEM TO YOUR DOCTOR. ° °1. A  prescription for pain medication may be given to you upon discharge.  Take your pain medication as prescribed, if needed.  If narcotic pain medicine is not needed, then you may take acetaminophen (Tylenol) or ibuprofen (Advil) as needed. °2. Take your usually prescribed medications unless otherwise directed. °If you need a refill on your pain medication, please contact your pharmacy.  They will contact our office to request authorization. Prescriptions will not be filled after 5 pm or on week-ends. °3. You should follow a light diet the first 24 hours after arrival home, such as soup and crackers, etc.  Be sure to include lots of fluids daily.  Resume your normal diet the day after surgery. °4.Most patients will experience some swelling and bruising around the umbilicus or in the groin and scrotum.  Ice packs and reclining will help.  Swelling and bruising can take several days to resolve.  °6. It is common to experience some constipation if taking pain medication after surgery.  Increasing fluid intake and taking a stool softener (such as Colace) will usually help or prevent this problem from occurring.  A mild laxative (Milk of Magnesia or Miralax) should be taken according to package directions if there are no bowel movements after 48 hours. °7. Unless discharge instructions indicate otherwise, you may remove your bandages 24-48 hours after surgery, and you may shower at that time.  You may have steri-strips (small skin tapes) in place directly over the incision.  These strips should be left on the skin for 7-10 days.  If your surgeon used skin glue on the incision, you may shower in  24 hours.  The glue will flake off over the next 2-3 weeks.  Any sutures or staples will be removed at the office during your follow-up visit. °8. ACTIVITIES:  You may resume regular (light) daily activities beginning the next day--such as daily self-care, walking, climbing stairs--gradually increasing activities as tolerated.  You may have sexual intercourse when it is comfortable.  Refrain from any heavy lifting or straining until approved by your doctor. ° °a.You may drive when you are no longer taking prescription pain medication, you can comfortably wear a seatbelt, and you can safely maneuver your car and apply brakes. °b.RETURN TO WORK:   °_____________________________________________ ° °9.You should see your doctor in the office for a follow-up appointment approximately 2-3 weeks after your surgery.  Make sure that you call for this appointment within a day or two after you arrive home to insure a convenient appointment time. °10.OTHER INSTRUCTIONS: _________________________ °   _____________________________________ ° °WHEN TO CALL YOUR DOCTOR: °1. Fever over 101.0 °2. Inability to urinate °3. Nausea and/or vomiting °4. Extreme swelling or bruising °5. Continued bleeding from incision. °6. Increased pain, redness, or drainage from the incision ° °The clinic staff is available to answer your questions during regular business hours.  Please don’t hesitate to call and ask to speak to one of the nurses for clinical concerns.  If you have a medical emergency, go to the nearest emergency room or call 911.  A surgeon from [facility] is always on call at the hospital ° ° °[address], [address], [place], [place]  [address] ? ° [address] [address], [place], [place]   [address] °[phone] ? [phone] ? FAX [phone] °Web site: [email or web address] ° °

## 2017-09-29 NOTE — Anesthesia Preprocedure Evaluation (Addendum)
Anesthesia Evaluation  Patient identified by MRN, date of birth, ID band Patient awake    Reviewed: Allergy & Precautions, NPO status , Patient's Chart, lab work & pertinent test results  Airway Mallampati: II  TM Distance: >3 FB Neck ROM: Full    Dental no notable dental hx. (+) Teeth Intact, Dental Advisory Given   Pulmonary asthma , COPD,  COPD inhaler, former smoker,    Pulmonary exam normal breath sounds clear to auscultation       Cardiovascular negative cardio ROS Normal cardiovascular exam Rhythm:Regular Rate:Normal     Neuro/Psych negative neurological ROS  negative psych ROS   GI/Hepatic negative GI ROS, Neg liver ROS,   Endo/Other  negative endocrine ROS  Renal/GU negative Renal ROS  negative genitourinary   Musculoskeletal negative musculoskeletal ROS (+)   Abdominal   Peds negative pediatric ROS (+)  Hematology negative hematology ROS (+)   Anesthesia Other Findings   Reproductive/Obstetrics negative OB ROS                            Anesthesia Physical Anesthesia Plan  ASA: III  Anesthesia Plan: General   Post-op Pain Management:    Induction: Intravenous  PONV Risk Score and Plan: 2 and Ondansetron and Treatment may vary due to age or medical condition  Airway Management Planned: Oral ETT  Additional Equipment:   Intra-op Plan:   Post-operative Plan: Extubation in OR  Informed Consent: I have reviewed the patients History and Physical, chart, labs and discussed the procedure including the risks, benefits and alternatives for the proposed anesthesia with the patient or authorized representative who has indicated his/her understanding and acceptance.   Dental advisory given  Plan Discussed with: CRNA  Anesthesia Plan Comments:         Anesthesia Quick Evaluation

## 2017-09-29 NOTE — Anesthesia Procedure Notes (Signed)
Procedure Name: Intubation Date/Time: 09/29/2017 5:28 PM Performed by: Barrington Ellison, CRNA Pre-anesthesia Checklist: Patient identified, Emergency Drugs available, Suction available and Patient being monitored Patient Re-evaluated:Patient Re-evaluated prior to induction Oxygen Delivery Method: Circle System Utilized Preoxygenation: Pre-oxygenation with 100% oxygen Induction Type: IV induction Ventilation: Mask ventilation without difficulty Laryngoscope Size: Mac and 3 Grade View: Grade I Tube type: Oral Tube size: 7.5 mm Number of attempts: 1 Airway Equipment and Method: Stylet and Oral airway Placement Confirmation: ETT inserted through vocal cords under direct vision,  positive ETCO2 and breath sounds checked- equal and bilateral Secured at: 23 cm Tube secured with: Tape Dental Injury: Teeth and Oropharynx as per pre-operative assessment

## 2017-09-29 NOTE — Op Note (Addendum)
09/29/2017  6:05 PM  PATIENT:  Joseph Bates  72 y.o. male  PRE-OPERATIVE DIAGNOSIS:  umbilical hernia  POST-OPERATIVE DIAGNOSIS:  umbilical hernia  PROCEDURE:  Procedure(s): LAPAROSCOPIC INCISIONAL, INCARCERATED, HERNIA WITH MESH (N/A) INSERTION OF MESH (N/A)  SURGEON:  Surgeon(s) and Role:    * Ralene Ok, MD - Primary  ANESTHESIA:   local and general  EBL:  minimal   BLOOD ADMINISTERED:none  DRAINS: none   LOCAL MEDICATIONS USED:  BUPIVICAINE   SPECIMEN:  No Specimen  DISPOSITION OF SPECIMEN:  N/A  COUNTS:  YES  TOURNIQUET:  * No tourniquets in log *  DICTATION: .Dragon Dictation  Details of the procedure:   After the patient was consented patient was taken back to the operating room patient was then placed in supine position bilateral SCDs in place.  The patient was prepped and draped in the usual sterile fashion. After antibiotics were confirmed a timeout was called and all facts were verified. The Veress needle technique was used to insuflate the abdomen at Palmer's point. The abdomen was insufflated to 14 mm mercury. Subsequently a 5 mm trocar was placed a camera inserted there was no injury to any intra-abdominal organs.    There was seen to be an incarcerated  incisional hernia.  A second camera port was in placed into the left lower quadrant.   At this the Falicform ligament was taken down with Bovie cautery maintaining hemostasis.   I proceeded to reduce the hernia contents.  Once the hernia was cleared away, a Bard Ventralight 15.2cm  mesh was inserted into the abdomen.  The mesh was secured circumferentially with am Securestrap tacker in a double crown fashion.  2-0 prolenes were used at 3:00, 6:00, 9:00, and 12:00 as transfascial sutures using a endoclose decive.    The omentum was brought over the area of the mesh. The pneumoperitoneum was evacuated  & all trocars  were removed. The skin was reapproximated with 4-0  Monocryl sutures in a subcuticular  fashion. The skin was dressed with Dermabond.  The patient was taken to the recovery room in stable condition.   PLAN OF CARE: Discharge to home after PACU  PATIENT DISPOSITION:  PACU - hemodynamically stable.   Delay start of Pharmacological VTE agent (>24hrs) due to surgical blood loss or risk of bleeding: not applicable

## 2017-09-29 NOTE — Anesthesia Postprocedure Evaluation (Signed)
Anesthesia Post Note  Patient: Joseph Bates  Procedure(s) Performed: LAPAROSCOPIC UMBILICAL HERNIA WITH MESH (N/A Abdomen) INSERTION OF MESH (N/A Abdomen)     Patient location during evaluation: PACU Anesthesia Type: General Level of consciousness: sedated and patient cooperative Pain management: pain level controlled Vital Signs Assessment: post-procedure vital signs reviewed and stable Respiratory status: spontaneous breathing Cardiovascular status: stable Anesthetic complications: no    Last Vitals:  Vitals:   09/29/17 1821 09/29/17 1836  BP: (!) 176/92 (!) 149/77  Pulse: 69 60  Resp: 12 15  Temp: (!) 36.4 C   SpO2: 96% 99%    Last Pain:  Vitals:   09/29/17 1835  TempSrc:   PainSc: [place]

## 2017-09-29 NOTE — H&P (Signed)
History of Present Illness  The patient is a 72 year old male who presents with an incisional hernia. Referred by: Patient referred by Dr. Harrington Challenger Chief Complaint: Recurrent umbilical hernia  Patient is a 72 year old male who states he had a previous open umbilical hernia repair with mesh approximately 20 years ago. He states over the last 7 years hernia recur with a small bulge just the left side of his abdomen. Patient states that he has had minimal pain. 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 Patient has had no signs or symptoms of incarceration or strangulation. He states he is able to reduce hernia with pressure.  Patient had no previous abdominal surgeries. 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   Past Surgical History  Knee Surgery  Bilateral. Ventral / Umbilical Hernia Surgery  Right.  Diagnostic Studies History  Colonoscopy  within last year  Allergies  Aleve *ANALGESICS - ANTI-INFLAMMATORY*  Allergies Reconciled   Medication History  Atorvastatin Calcium (10MG  Tablet, Oral) Active. Symbicort (160-4.5MCG/ACT Aerosol, Inhalation) Active.  Social History  Alcohol use  Occasional alcohol use. Caffeine use  Carbonated beverages, Coffee, Tea. Illicit drug use  Remotely quit drug use. Tobacco use  Former smoker.  Family History Alcohol Abuse  Brother, Father. Arthritis  Mother.  Other Problems Arthritis  Asthma  Back Pain  Chronic Obstructive Lung Disease  Gastroesophageal Reflux Disease  Kidney Stone  Umbilical Hernia Repair     Review of Systems General Not Present- Appetite Loss, Chills, Fatigue, Fever, Night Sweats, Weight Gain and Weight Loss. Skin Not Present- Change in Wart/Mole, Dryness, Hives, Jaundice, New Lesions, Non-Healing Wounds, Rash and Ulcer. HEENT Present- Wears glasses/contact lenses. Not Present- Earache, Hearing Loss, Hoarseness,  Nose Bleed, Oral Ulcers, Ringing in the Ears, Seasonal Allergies, Sinus Pain, Sore Throat, Visual Disturbances and Yellow Eyes. Respiratory Present- Snoring. Not Present- Bloody sputum, Chronic Cough, Difficulty Breathing and Wheezing. Breast Not Present- Breast Mass, Breast Pain, Nipple Discharge and Skin Changes. Cardiovascular Not Present- Chest Pain, Difficulty Breathing Lying Down, Leg Cramps, Palpitations, Rapid Heart Rate, Shortness of Breath and Swelling of Extremities. Gastrointestinal Not Present- Abdominal Pain, Bloating, Bloody Stool, Change in Bowel Habits, Chronic diarrhea, Constipation, Difficulty Swallowing, Excessive gas, Gets full quickly at meals, Hemorrhoids, Indigestion, Nausea, Rectal Pain and Vomiting. Male Genitourinary Not Present- Blood in Urine, Change in Urinary Stream, Frequency, Impotence, Nocturia, Painful Urination, Urgency and Urine Leakage. Musculoskeletal Not Present- Back Pain, Joint Pain, Joint Stiffness, Muscle Pain, Muscle Weakness and Swelling of Extremities. Neurological Not Present- Decreased Memory, Fainting, Headaches, Numbness, Seizures, Tingling, Tremor, Trouble walking and Weakness. Psychiatric Not Present- Anxiety, Bipolar, Change in Sleep Pattern, Depression, Fearful and Frequent crying. Endocrine Not Present- Cold Intolerance, Excessive Hunger, Hair Changes, Heat Intolerance, Hot flashes and New Diabetes. Hematology Not Present- Blood Thinners, Easy Bruising, Excessive bleeding, Gland problems, HIV and Persistent Infections. All other systems negative  BP (!) 160/86   Pulse (!) 57   Temp 97.6 F (36.4 C) (Oral)   Resp 18   Ht 5\' 10"  (1.778 m)   Wt 110.7 kg (244 lb)   SpO2 98%   BMI 35.01 kg/m     Physical Exam  The physical exam findings are as follows: Note:Constitutional: No acute distress, conversant, appears stated age  Eyes: Anicteric sclerae, moist conjunctiva, no lid lag  Neck: No thyromegaly, trachea midline, no cervical  lymphadenopathy  Lungs: Clear to auscultation biilaterally, normal respiratory effot  Cardiovascular: regular rate & rhythm, no murmurs, no  peripheal edema, pedal pulses 2+  GI: Soft, no masses or hepatosplenomegaly, non-tender to palpation  MSK: Normal gait, no clubbing cyanosis, edema  Skin: No rashes, palpation reveals normal skin turgor  Psychiatric: Appropriate judgment and insight, oriented to person, place, and time  Abdomen Inspection Hernias - Incisional - Reducible(At the umbilicus, appears hernia sac is just a left a umbilicus).    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 1. The patient will like to proceed to the operating room for laparoscopic ventral hernia repair with mesh.  2. I discussed with the patient the signs and symptoms of incarceration and strangulation and the need to proceed to the ER should they occur.  3. I discussed with the patient the risks and benefits of the procedure to include but not limited to: Infection, bleeding, damage to surrounding structures, possible need for further surgery, possible nerve pain, and possible recurrence. The patient was understanding and wishes to proceed.

## 2017-09-30 ENCOUNTER — Encounter (HOSPITAL_COMMUNITY): Payer: Self-pay | Admitting: General Surgery

## 2017-10-01 ENCOUNTER — Encounter (HOSPITAL_COMMUNITY): Payer: Self-pay | Admitting: General Surgery

## 2017-12-18 DIAGNOSIS — E7800 Pure hypercholesterolemia, unspecified: Secondary | ICD-10-CM | POA: Diagnosis not present

## 2017-12-18 DIAGNOSIS — Z23 Encounter for immunization: Secondary | ICD-10-CM | POA: Diagnosis not present

## 2017-12-18 DIAGNOSIS — Z131 Encounter for screening for diabetes mellitus: Secondary | ICD-10-CM | POA: Diagnosis not present

## 2017-12-21 DIAGNOSIS — Z79899 Other long term (current) drug therapy: Secondary | ICD-10-CM | POA: Diagnosis not present

## 2017-12-21 DIAGNOSIS — E7800 Pure hypercholesterolemia, unspecified: Secondary | ICD-10-CM | POA: Diagnosis not present

## 2017-12-21 DIAGNOSIS — J4540 Moderate persistent asthma, uncomplicated: Secondary | ICD-10-CM | POA: Diagnosis not present

## 2017-12-21 DIAGNOSIS — Z0000 Encounter for general adult medical examination without abnormal findings: Secondary | ICD-10-CM | POA: Diagnosis not present

## 2018-02-10 DIAGNOSIS — Z23 Encounter for immunization: Secondary | ICD-10-CM | POA: Diagnosis not present

## 2018-03-27 ENCOUNTER — Emergency Department (HOSPITAL_COMMUNITY)
Admission: EM | Admit: 2018-03-27 | Discharge: 2018-03-28 | Disposition: A | Discharge status: Home / Self Care | Payer: Medicare Other | Attending: Emergency Medicine | Admitting: Emergency Medicine

## 2018-03-27 ENCOUNTER — Encounter (HOSPITAL_COMMUNITY): Payer: Self-pay | Admitting: *Deleted

## 2018-03-27 ENCOUNTER — Other Ambulatory Visit: Payer: Self-pay

## 2018-03-27 DIAGNOSIS — Y9389 Activity, other specified: Secondary | ICD-10-CM | POA: Diagnosis not present

## 2018-03-27 DIAGNOSIS — X58XXXA Exposure to other specified factors, initial encounter: Secondary | ICD-10-CM | POA: Diagnosis not present

## 2018-03-27 DIAGNOSIS — K222 Esophageal obstruction: Secondary | ICD-10-CM

## 2018-03-27 DIAGNOSIS — T17228A Food in pharynx causing other injury, initial encounter: Secondary | ICD-10-CM | POA: Insufficient documentation

## 2018-03-27 DIAGNOSIS — Y999 Unspecified external cause status: Secondary | ICD-10-CM | POA: Diagnosis not present

## 2018-03-27 DIAGNOSIS — Z79899 Other long term (current) drug therapy: Secondary | ICD-10-CM | POA: Diagnosis not present

## 2018-03-27 DIAGNOSIS — T18128A Food in esophagus causing other injury, initial encounter: Secondary | ICD-10-CM | POA: Diagnosis not present

## 2018-03-27 DIAGNOSIS — Z87891 Personal history of nicotine dependence: Secondary | ICD-10-CM | POA: Insufficient documentation

## 2018-03-27 DIAGNOSIS — Y929 Unspecified place or not applicable: Secondary | ICD-10-CM | POA: Diagnosis not present

## 2018-03-27 DIAGNOSIS — J449 Chronic obstructive pulmonary disease, unspecified: Secondary | ICD-10-CM | POA: Diagnosis not present

## 2018-03-27 MED ORDER — GLUCAGON HCL RDNA (DIAGNOSTIC) 1 MG IJ SOLR
1.0000 mg | Freq: Once | INTRAMUSCULAR | Status: AC
  Administered 2018-03-27: 1 mg via INTRAVENOUS
  Filled 2018-03-27: qty 1

## 2018-03-27 MED ORDER — LORAZEPAM 2 MG/ML IJ SOLN
1.0000 mg | Freq: Once | INTRAMUSCULAR | Status: AC
  Administered 2018-03-27: 1 mg via INTRAVENOUS
  Filled 2018-03-27: qty 1

## 2018-03-27 MED ORDER — NITROGLYCERIN 0.4 MG SL SUBL
0.4000 mg | SUBLINGUAL_TABLET | Freq: Once | SUBLINGUAL | Status: AC
  Administered 2018-03-27: 0.4 mg via SUBLINGUAL
  Filled 2018-03-27: qty 1

## 2018-03-27 NOTE — ED Triage Notes (Signed)
Pt stated "I got some rib eye stuck in my throat between 1800 & 1830.  I took a NTG @ 1930 and then another a few minutes after.  I tried to drink water and within less than a minute I heaved it back up.  The last NTG I took about 2230.  After that one I started coughing and clear mucus came up."  Pt is no apparent distress @ this time.  Is speaking in full sentences without difficulty.

## 2018-03-28 NOTE — ED Provider Notes (Signed)
Emergency Department Provider Note   I have reviewed the triage vital signs and the nursing notes.   HISTORY  Chief Complaint foreign body in throat   HPI Joseph Bates is a 73 y.o. male who has a history of some impaction but usually gets better with nitroglycerin.  He had his esophagus stretched at some point in the distant past tonight he was eaten a bit by steak and around 7:00 had the sensation of it being stuck.  Does not really tolerate fluids since then he keeps vomiting up.  One time he vomited up a bunch of secretions as well.  He does note distress but states that he feels the foreign body sensation is still present.  Last time he took nitroglycerin was a 20-30.  No other associate symptoms such as shortness of breath, diarrhea, constipation or fevers.  No rashes.  Patient states is exactly like his impactions in the past. No other associated or modifying symptoms.    Past Medical History:  Diagnosis Date  . Arthritis   . Asthma   . COPD (chronic obstructive pulmonary disease) ([place])   . Dyspnea    only if he does not do his symbicort  . GERD (gastroesophageal reflux disease)    occasionaly  . History of kidney stones   . Hyperlipidemia     Patient Active Problem List   Diagnosis Date Noted  . Obesity, morbid ([place]) 01/03/2015  . Asthmatic bronchitis , chronic ([place]) 05/08/2008  . HYPERLIPIDEMIA 03/01/2007    Past Surgical History:  Procedure Laterality Date  . HERNIA REPAIR    . INSERTION OF MESH N/A 09/29/2017   Procedure: INSERTION OF MESH;  Surgeon: Ralene Ok, MD;  Location: [facility];  Service: General;  Laterality: N/A;  . JOINT REPLACEMENT Bilateral    knee  . UMBILICAL HERNIA REPAIR N/A 09/29/2017   Procedure: LAPAROSCOPIC UMBILICAL HERNIA WITH MESH;  Surgeon: Ralene Ok, MD;  Location: [facility];  Service: General;  Laterality: N/A;  . vocal cord surgery      Current Outpatient Rx  . Order #: [id number] Class: Print  . Order #: [id number] Class:  Historical Med  . Order #: [id number] Class: Print  . Order #: [id number] Class: Historical Med  . Order #: [id number] Class: Print    Allergies Naproxen sodium  Family History  Problem Relation Age of Onset  . Ovarian cancer Maternal Grandmother     Social History Social History   Tobacco Use  . Smoking status: Former Smoker    Packs/day: 2.00    Years: 40.00    Pack years: 80.00    Types: Cigarettes    Last attempt to quit: 12/14/2005    Years since quitting: 12.2  . Smokeless tobacco: Never Used  Substance Use Topics  . Alcohol use: Yes    Comment: rarely  . Drug use: Never    Review of Systems  All other systems negative except as documented in the HPI. All pertinent positives and negatives as reviewed in the HPI. ____________________________________________   PHYSICAL EXAM:  VITAL SIGNS: ED Triage Vitals  Enc Vitals Group     BP 03/27/18 2300 (!) 163/91     Pulse Rate 03/27/18 2300 66     Resp 03/27/18 2300 16     Temp 03/27/18 2300 98.4 F (36.9 C)     Temp Source 03/27/18 2300 Oral     SpO2 03/27/18 2300 96 %     Weight 03/27/18 2300 252 lb (114.3 kg)     Height  03/27/18 2300 5\' 10"  (1.778 m)    Constitutional: Alert and oriented. Well appearing and in no acute distress. Eyes: Conjunctivae are normal. PERRL. EOMI. Head: Atraumatic. Nose: No congestion/rhinnorhea. Mouth/Throat: Mucous membranes are moist.  Oropharynx non-erythematous. Neck: No stridor.  No meningeal signs.   Cardiovascular: Normal rate, regular rhythm. Good peripheral circulation. Grossly normal heart sounds.   Respiratory: Normal respiratory effort.  No retractions. Lungs CTAB. Gastrointestinal: Soft and nontender. No distention.  Musculoskeletal: No lower extremity tenderness nor edema. No gross deformities of extremities. Neurologic:  Normal speech and language. No gross focal neurologic deficits are appreciated.  Skin:  Skin is warm, dry and intact. No rash  noted.   ____________________________________________   INITIAL IMPRESSION / ASSESSMENT AND PLAN / ED COURSE  We will try ativan, nitroglycerin and glucagon and reevaluate.  Patient fell asleep woke up and can swallow.  Tolerating p.o.  Stable for discharge.  He will follow-up with GI for EGD to see if he needs another dilation.     Pertinent labs & imaging results that were available during my care of the patient were reviewed by me and considered in my medical decision making (see chart for details).  ____________________________________________  FINAL CLINICAL IMPRESSION(S) / ED DIAGNOSES  Final diagnoses:  None     MEDICATIONS GIVEN DURING THIS VISIT:  Medications  LORazepam (ATIVAN) injection 1 mg (1 mg Intravenous Given 03/27/18 2350)  glucagon (human recombinant) (GLUCAGEN) injection 1 mg (1 mg Intravenous Given 03/27/18 2350)  nitroGLYCERIN (NITROSTAT) SL tablet 0.4 mg (0.4 mg Sublingual Given 03/27/18 2350)     NEW OUTPATIENT MEDICATIONS STARTED DURING THIS VISIT:  Discharge Medication List as of 03/28/2018  1:51 AM      Note:  This note was prepared with assistance of Dragon voice recognition software. Occasional wrong-word or sound-a-like substitutions may have occurred due to the inherent limitations of voice recognition software.   Cassadie Pankonin, Corene Cornea, MD 03/28/18 [phone]

## 2018-12-07 IMAGING — US US AORTA SCREENING (MEDICARE)
1 series · 7 of 7 positions shown · non-contrast
Comparison: None.

CLINICAL DATA: Male between 65-75 years of age with a smoking
history.

EXAM:
US ABDOMINAL AORTA MEDICARE SCREENING
TECHNIQUE: Ultrasound examination of the abdominal aorta was performed as a
screening evaluation for abdominal aortic aneurysm.

[Series 1: us aorta screening (medicare) · 0.37mm/px · 7 of 7 slices shown]
[im 1/7]
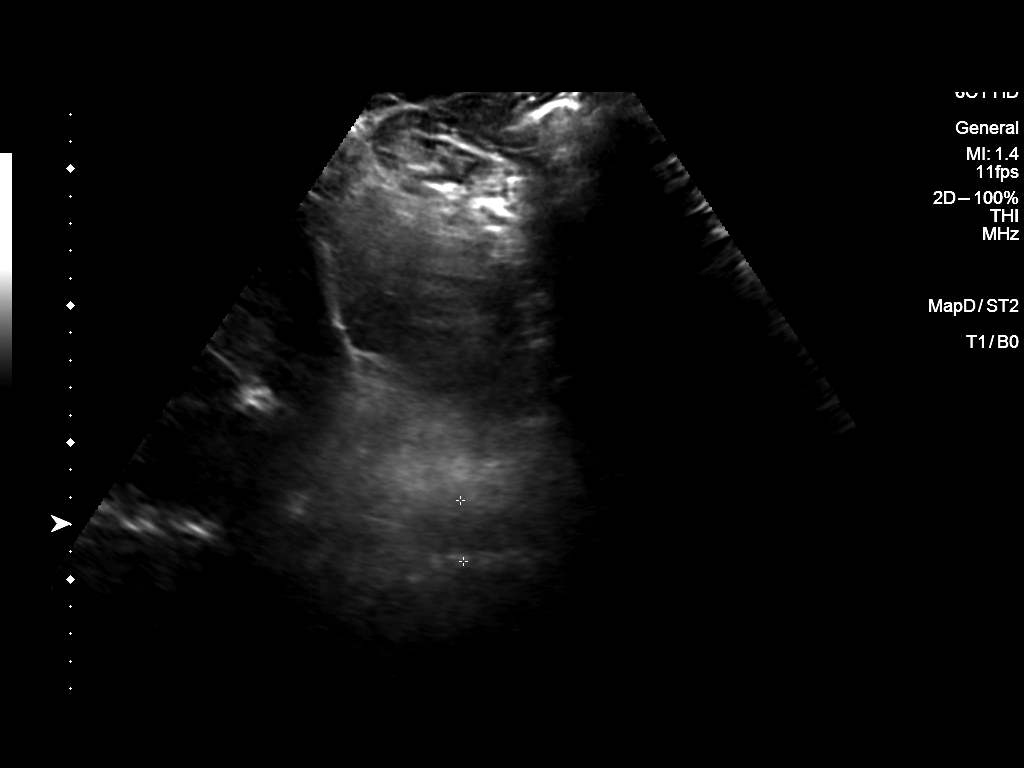
[im 2/7]
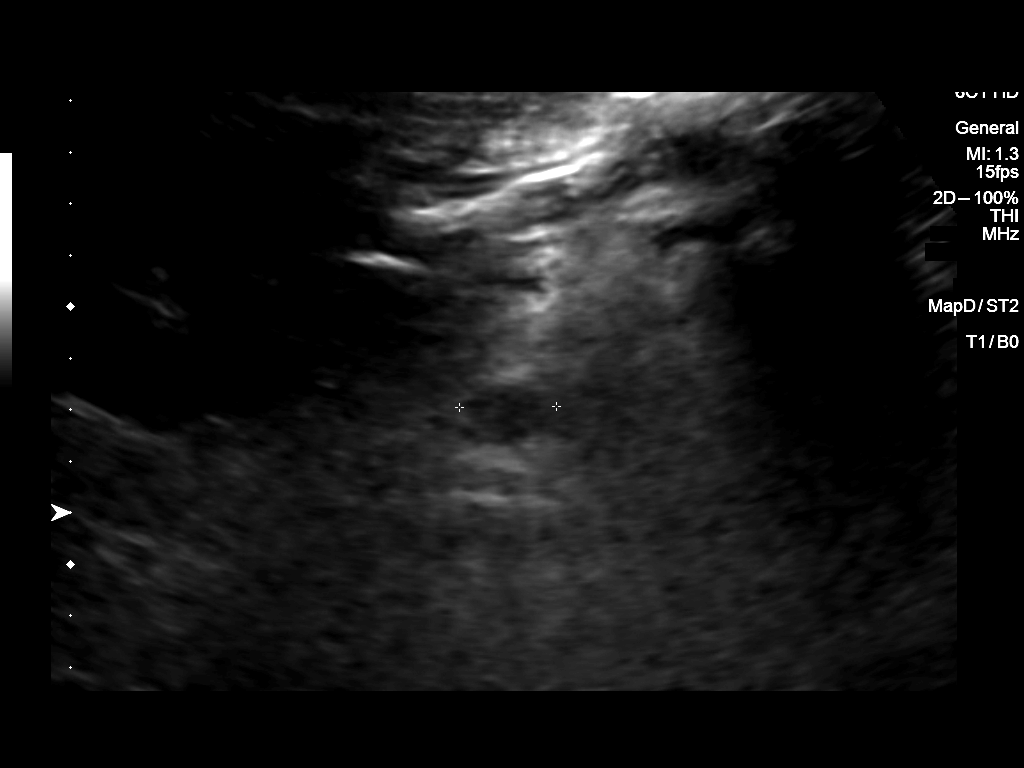
[im 3/7]
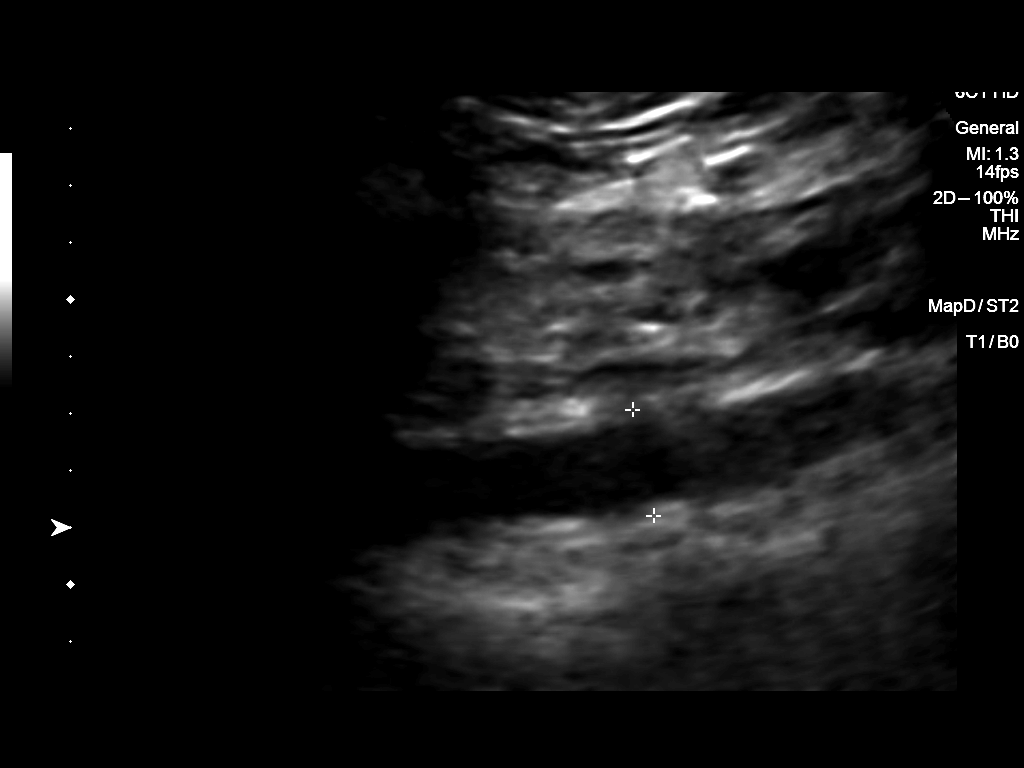
[im 4/7]
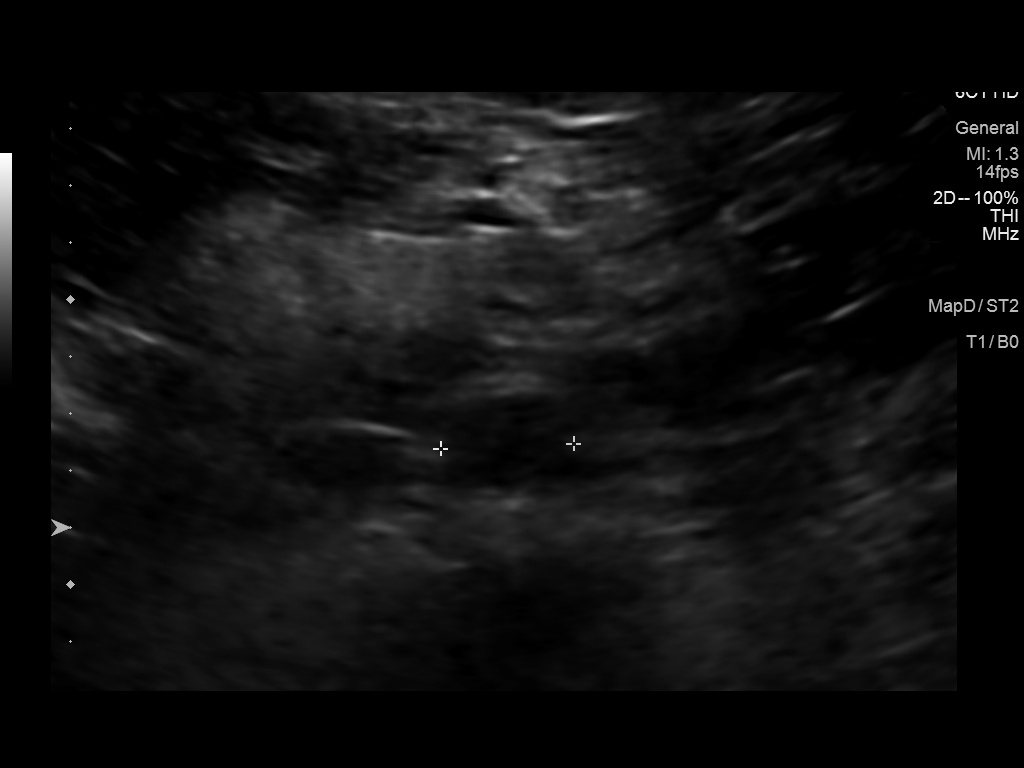
[im 5/7]
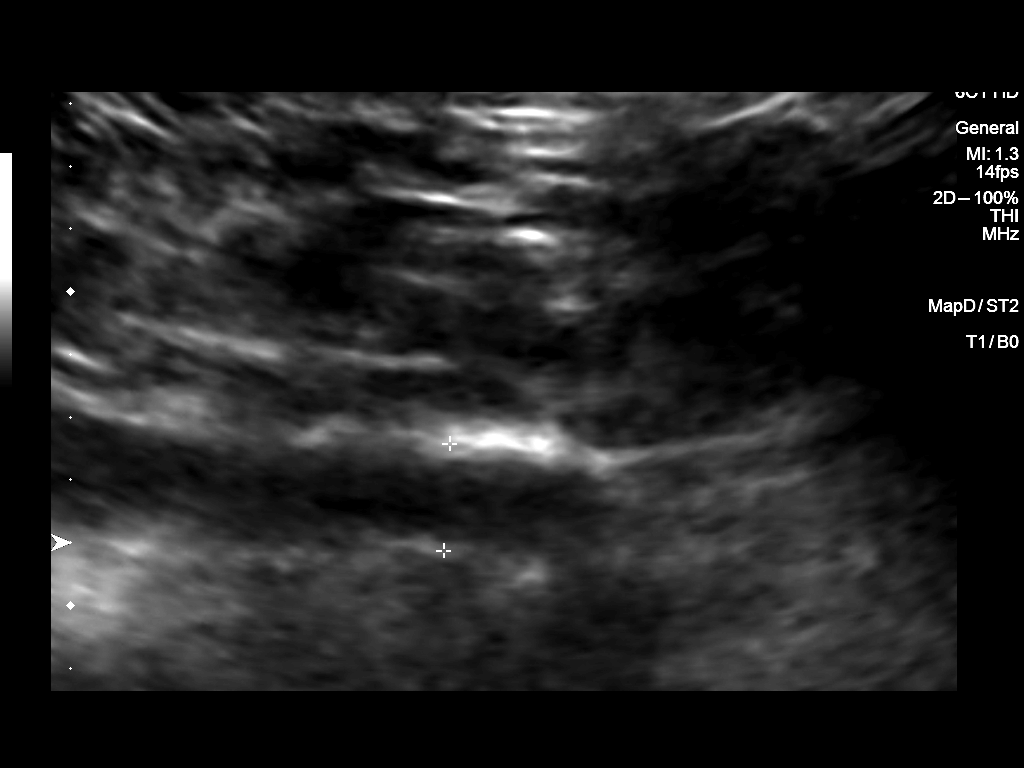
[im 6/7]
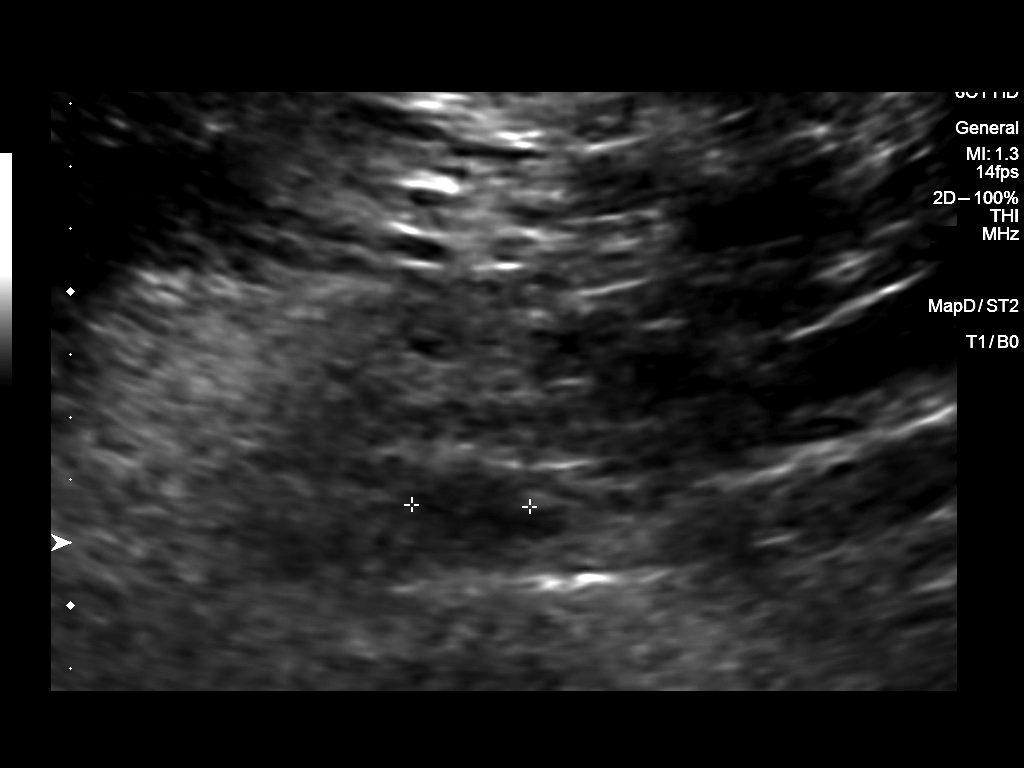
[im 7/7]
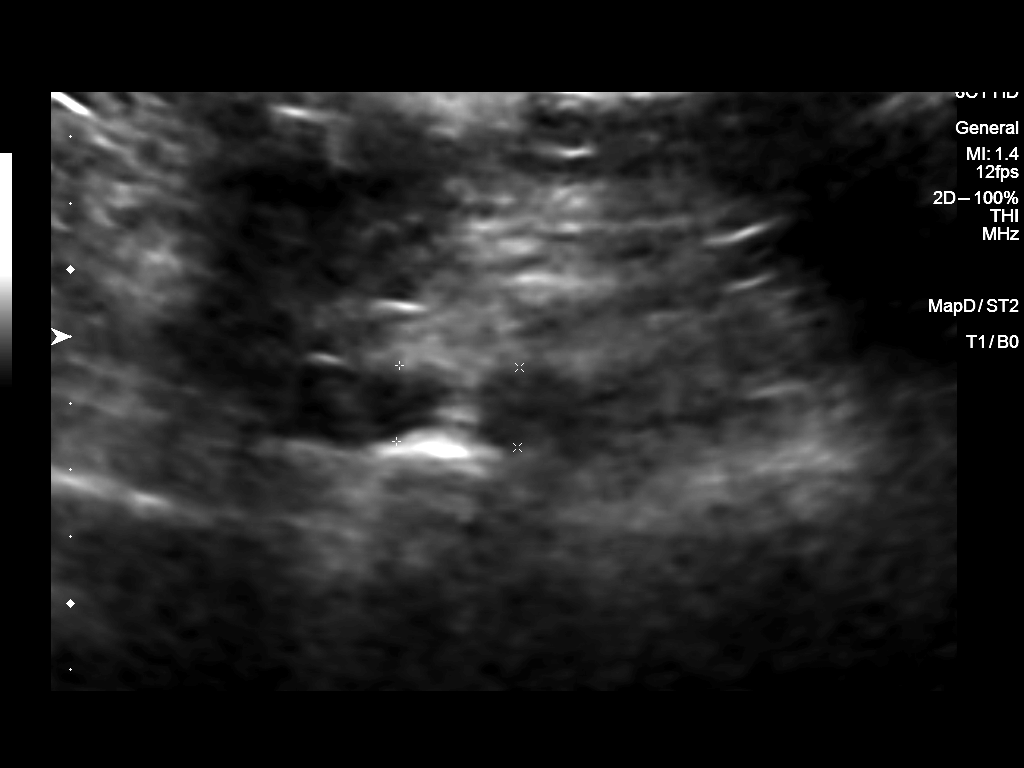

[7 of 7 positions shown; findings below may reference images not displayed]

FINDINGS: Abdominal aortic measurements as follows:

Proximal:  2.2 cm

Mid:  1.9 cm

Distal:  1.9 cm

Minor aortic atherosclerosis.  No significant aneurysm
IMPRESSION: Aortic atherosclerosis without aneurysm.  Maximal diameter 2.2 cm.

## 2018-12-20 DIAGNOSIS — Z23 Encounter for immunization: Secondary | ICD-10-CM | POA: Diagnosis not present

## 2019-01-17 DIAGNOSIS — J4540 Moderate persistent asthma, uncomplicated: Secondary | ICD-10-CM | POA: Diagnosis not present

## 2019-01-17 DIAGNOSIS — E7800 Pure hypercholesterolemia, unspecified: Secondary | ICD-10-CM | POA: Diagnosis not present

## 2019-01-17 DIAGNOSIS — Z0000 Encounter for general adult medical examination without abnormal findings: Secondary | ICD-10-CM | POA: Diagnosis not present

## 2019-01-17 DIAGNOSIS — Z79899 Other long term (current) drug therapy: Secondary | ICD-10-CM | POA: Diagnosis not present

## 2019-12-08 ENCOUNTER — Other Ambulatory Visit: Payer: Medicare Other

## 2019-12-08 DIAGNOSIS — Z20822 Contact with and (suspected) exposure to covid-19: Secondary | ICD-10-CM

## 2019-12-10 LAB — SARS-COV-2, NAA 2 DAY TAT

## 2019-12-10 LAB — NOVEL CORONAVIRUS, NAA: SARS-CoV-2, NAA: NOT DETECTED

## 2020-01-07 ENCOUNTER — Ambulatory Visit: Payer: Medicare Other | Attending: Internal Medicine

## 2020-01-07 DIAGNOSIS — Z23 Encounter for immunization: Secondary | ICD-10-CM

## 2020-01-07 NOTE — Progress Notes (Signed)
   Covid-19 Vaccination Clinic  Name:  ASTER ECKRICH    MRN: [id number] DOB: 07/16/1945  01/07/2020  Mr. Petrosyan was observed post Covid-19 immunization for 15 minutes without incident. He was provided with Vaccine Information Sheet and instruction to access the V-Safe system.   Mr. Borba was instructed to call 911 with any severe reactions post vaccine: Marland Kitchen Difficulty breathing  . Swelling of face and throat  . A fast heartbeat  . A bad rash all over body  . Dizziness and weakness

## 2020-07-19 DIAGNOSIS — H2511 Age-related nuclear cataract, right eye: Secondary | ICD-10-CM | POA: Diagnosis not present

## 2020-07-19 DIAGNOSIS — H2512 Age-related nuclear cataract, left eye: Secondary | ICD-10-CM | POA: Diagnosis not present

## 2020-07-19 DIAGNOSIS — H5213 Myopia, bilateral: Secondary | ICD-10-CM | POA: Diagnosis not present

## 2020-07-19 DIAGNOSIS — H52223 Regular astigmatism, bilateral: Secondary | ICD-10-CM | POA: Diagnosis not present

## 2020-07-19 DIAGNOSIS — H524 Presbyopia: Secondary | ICD-10-CM | POA: Diagnosis not present

## 2020-08-03 DIAGNOSIS — H2511 Age-related nuclear cataract, right eye: Secondary | ICD-10-CM | POA: Diagnosis not present

## 2020-08-03 DIAGNOSIS — H2513 Age-related nuclear cataract, bilateral: Secondary | ICD-10-CM | POA: Diagnosis not present

## 2020-10-18 DIAGNOSIS — H2511 Age-related nuclear cataract, right eye: Secondary | ICD-10-CM | POA: Diagnosis not present

## 2020-10-19 DIAGNOSIS — H25042 Posterior subcapsular polar age-related cataract, left eye: Secondary | ICD-10-CM | POA: Diagnosis not present

## 2020-10-19 DIAGNOSIS — H25012 Cortical age-related cataract, left eye: Secondary | ICD-10-CM | POA: Diagnosis not present

## 2020-10-19 DIAGNOSIS — H2512 Age-related nuclear cataract, left eye: Secondary | ICD-10-CM | POA: Diagnosis not present

## 2020-11-08 DIAGNOSIS — H2512 Age-related nuclear cataract, left eye: Secondary | ICD-10-CM | POA: Diagnosis not present

## 2021-01-25 DIAGNOSIS — E7800 Pure hypercholesterolemia, unspecified: Secondary | ICD-10-CM | POA: Diagnosis not present

## 2021-01-25 DIAGNOSIS — Z79899 Other long term (current) drug therapy: Secondary | ICD-10-CM | POA: Diagnosis not present

## 2021-01-29 DIAGNOSIS — R030 Elevated blood-pressure reading, without diagnosis of hypertension: Secondary | ICD-10-CM | POA: Diagnosis not present

## 2021-01-29 DIAGNOSIS — J4540 Moderate persistent asthma, uncomplicated: Secondary | ICD-10-CM | POA: Diagnosis not present

## 2021-01-29 DIAGNOSIS — Z0000 Encounter for general adult medical examination without abnormal findings: Secondary | ICD-10-CM | POA: Diagnosis not present

## 2021-01-29 DIAGNOSIS — L989 Disorder of the skin and subcutaneous tissue, unspecified: Secondary | ICD-10-CM | POA: Diagnosis not present

## 2021-01-29 DIAGNOSIS — R0609 Other forms of dyspnea: Secondary | ICD-10-CM | POA: Diagnosis not present

## 2021-01-29 DIAGNOSIS — E7800 Pure hypercholesterolemia, unspecified: Secondary | ICD-10-CM | POA: Diagnosis not present

## 2021-01-29 DIAGNOSIS — N183 Chronic kidney disease, stage 3 unspecified: Secondary | ICD-10-CM | POA: Diagnosis not present

## 2021-01-29 DIAGNOSIS — R1310 Dysphagia, unspecified: Secondary | ICD-10-CM | POA: Diagnosis not present

## 2021-03-18 ENCOUNTER — Encounter: Payer: Self-pay | Admitting: Internal Medicine

## 2021-03-18 ENCOUNTER — Other Ambulatory Visit: Payer: Self-pay

## 2021-03-18 ENCOUNTER — Ambulatory Visit: Payer: Medicare Other | Admitting: Internal Medicine

## 2021-03-18 VITALS — BP 126/72 | HR 75 | Ht 69.0 in | Wt 265.0 lb

## 2021-03-18 DIAGNOSIS — R0609 Other forms of dyspnea: Secondary | ICD-10-CM | POA: Diagnosis not present

## 2021-03-18 NOTE — Patient Instructions (Signed)
Medication Instructions:  No Changes In Medications at this time.  *If you need a refill on your cardiac medications before your next appointment, please call your pharmacy*  Testing/Procedures: Your physician has requested that you have an echocardiogram. Echocardiography is a painless test that uses sound waves to create images of your heart. It provides your doctor with information about the size and shape of your heart and how well your hearts chambers and valves are working. You may receive an ultrasound enhancing agent through an IV if needed to better visualize your heart during the echo.This procedure takes approximately one hour. There are no restrictions for this procedure. This will take place at the [address], [address].   Your physician has requested that you have a lexiscan myoview. For further information please visit [email or web address]. Please follow instruction sheet, as given. This will take place at [address]  Follow-Up: At [facility], you and your health needs are our priority.  As part of our continuing mission to provide you with exceptional heart care, we have created designated Provider Care Teams.  These Care Teams include your primary Cardiologist (physician) and Advanced Practice Providers (APPs -  Physician Assistants and Nurse Practitioners) who all work together to provide you with the care you need, when you need it.  Your next appointment:   3 month(s)  The format for your next appointment:   In Person  Provider:   Janina Mayo, MD

## 2021-03-18 NOTE — Progress Notes (Signed)
Cardiology Office Note:    Date:  03/18/2021   ID:  Joseph Bates, DOB [birth year], MRN [id number]  PCP:  Lawerance Cruel, MD   [place] Providers Cardiologist:  Janina Mayo, MD     Referring MD: Lawerance Cruel, MD   No chief complaint on file. Dyspnea on exertion  History of Present Illness:    Joseph Bates is a 76 y.o. male with a hx of obstructive lung dx,  CKD, COVID19 infection end of 2021, prolonged DOE, referral from [facility] Dr. Harrington Challenger for DOE  He states his DOE has not progressed. Prior to [place] he was very active, he now can't walk to the mailbox or pulling the trash can and he gets out of breath. It's been this way since his infection. He denies chest pain or squeezing. He plays golf , and has some challenges with walking up hill. He denies PND, orthopnea. Mild ankle swelling. He has asthma and he has been on symbicort for 15 years.  He was a former smoker, he smoked for 40 years.  He had a stress test 15 years ago and went on the treadmill. He has no hx of catheterization. May have had an echo.   He has no hx of hypertension. No hx of sleep apnea   Family hx: father died at 20 from brain aneurysm. Mother passed at 15.  CVD Risk/Equivalent: HLD- yes HTN- no PAD- No DMII -no Smoker-former Stroke-no Premature Family History- no    Labs 1//18/2022  Crt 1.34 T bili 1.7 Normal AST/ALT  TC 149 mg/dL LDL 84 mg/dL   Past Medical History:  Diagnosis Date   Arthritis    Asthma    COPD (chronic obstructive pulmonary disease) ([facility])    Dyspnea    only if he does not do his symbicort   GERD (gastroesophageal reflux disease)    occasionaly   History of kidney stones    Hyperlipidemia     Past Surgical History:  Procedure Laterality Date   HERNIA REPAIR     INSERTION OF MESH N/A 09/29/2017   Procedure: INSERTION OF MESH;  Surgeon: Ralene Ok, MD;  Location: [place] [facility];  Service: General;  Laterality: N/A;   JOINT REPLACEMENT Bilateral     knee   UMBILICAL HERNIA REPAIR N/A 09/29/2017   Procedure: LAPAROSCOPIC UMBILICAL HERNIA WITH MESH;  Surgeon: Ralene Ok, MD;  Location: [place];  Service: General;  Laterality: N/A;   vocal cord surgery      Current Medications: Current Meds  Medication Sig   albuterol (PROVENTIL HFA;VENTOLIN HFA) 108 (90 Base) MCG/ACT inhaler Every 4-6 hours prn. (Patient taking differently: Every 4-6 hours prn.)   atorvastatin (LIPITOR) 10 MG tablet Take 10 mg by mouth at bedtime.   budesonide-formoterol (SYMBICORT) 160-4.5 MCG/ACT inhaler Take 2 puffs first thing in am and then another 2 puffs about 12 hours later. (Patient taking differently: Inhale 2 puffs into the lungs 2 (two) times daily.)   ibuprofen (ADVIL,MOTRIN) 200 MG tablet Take 200 mg by mouth every 6 (six) hours as needed for headache or moderate pain.     Allergies:   Naproxen sodium   Social History   Socioeconomic History   Marital status: Married    Spouse name: Not on file   Number of children: Not on file   Years of education: Not on file   Highest education level: Not on file  Occupational History   Occupation: PREP ROOM WORKER AT [employer]  Tobacco Use   Smoking status: Former    Packs/day: 2.00    Years: 40.00    Pack years: 80.00    Types: Cigarettes    Quit date: 12/14/2005    Years since quitting: 15.2   Smokeless tobacco: Never  Vaping Use   Vaping Use: Never used  Substance and Sexual Activity   Alcohol use: Yes    Comment: rarely   Drug use: Never   Sexual activity: Not on file  Other Topics Concern   Not on file  Social History Narrative   Not on file   Social Determinants of Health   Financial Resource Strain: Not on file  Food Insecurity: Not on file  Transportation Needs: Not on file  Physical Activity: Not on file  Stress: Not on file  Social Connections: Not on file     Family History: The patient's family history includes Ovarian cancer in his maternal  grandmother.  ROS:   Please see the history of present illness.     All other systems reviewed and are negative.  EKGs/Labs/Other Studies Reviewed:    The following studies were reviewed today:   EKG:  EKG is  ordered today.  The ekg ordered today demonstrates   NSR, no ischemic changes  Recent Labs: No results found for requested labs within last 8760 hours.  Recent Lipid Panel No results found for: CHOL, TRIG, HDL, CHOLHDL, VLDL, LDLCALC, LDLDIRECT   Risk Assessment/Calculations:       Physical Exam:    VS:   Vitals:   03/18/21 0826  BP: 126/72  Pulse: 75  SpO2: 96%     Wt Readings from Last 3 Encounters:  03/18/21 265 lb (120.2 kg)  03/27/18 252 lb (114.3 kg)  09/29/17 244 lb (110.7 kg)     GEN:  Well nourished, well developed in no acute distress HEENT: Normal NECK: No JVD; No carotid bruits LYMPHATICS: No lymphadenopathy CARDIAC: RRR, no murmurs, rubs, gallops RESPIRATORY:  Clear to auscultation without rales, wheezing or rhonchi  ABDOMEN: Soft, non-tender, non-distended MUSCULOSKELETAL:  No edema; No deformity  SKIN: Warm and dry NEUROLOGIC:  Alert and oriented x 3 PSYCHIATRIC:  Normal affect   ASSESSMENT:    #DOE: Ddx includes coronary dx vs decompensated CHF vs long COVID. Valvular dx is also on the differential. He denies chest pressure, but does have risk factors including age and former smoker. He has no orthopnea, PND or elevated JVD, have lower suspicion of CHF. Will plan for nuclear stress test, echocardiogram and BNP today.  #HLD: continue atovastatin 10 mg daily -LDL at goal < 100 mg/dL  PLAN:    In order of problems listed above:  Lexiscan SPECT Echo Follow up 3 months      Shared Decision Making/Informed Consent The risks [chest pain, shortness of breath, cardiac arrhythmias, dizziness, blood pressure fluctuations, myocardial infarction, stroke/transient ischemic attack, nausea, vomiting, allergic reaction, radiation exposure,  metallic taste sensation and life-threatening complications (estimated to be 1 in 10,000)], benefits (risk stratification, diagnosing coronary artery disease, treatment guidance) and alternatives of a nuclear stress test were discussed in detail with Joseph Bates and he agrees to proceed.    Medication Adjustments/Labs and Tests Ordered: Current medicines are reviewed at length with the patient today.  Concerns regarding medicines are outlined above.  Orders Placed This Encounter  Procedures   MYOCARDIAL PERFUSION IMAGING   EKG 12-Lead   ECHOCARDIOGRAM COMPLETE   No orders of the defined types were placed in this encounter.  Patient Instructions  Medication Instructions:  No Changes In Medications at this time.  *If you need a refill on your cardiac medications before your next appointment, please call your pharmacy*  Testing/Procedures: Your physician has requested that you have an echocardiogram. Echocardiography is a painless test that uses sound waves to create images of your heart. It provides your doctor with information about the size and shape of your heart and how well your hearts chambers and valves are working. You may receive an ultrasound enhancing agent through an IV if needed to better visualize your heart during the echo.This procedure takes approximately one hour. There are no restrictions for this procedure. This will take place at the [address]   Your physician has requested that you have a lexiscan myoview. For further information please visit [email or web address]. Please follow instruction sheet, as given. This will take place at [address]  Follow-Up: At [facility], you and your health needs are our priority.  As part of our continuing mission to provide you with exceptional heart care, we have created designated Provider Care Teams.  These Care Teams include your primary Cardiologist (physician) and Advanced Practice Providers  (APPs -  Physician Assistants and Nurse Practitioners) who all work together to provide you with the care you need, when you need it.  Your next appointment:   3 month(s)  The format for your next appointment:   In Person  Provider:   Janina Mayo, MD      Signed, Janina Mayo, MD  03/18/2021 8:55 AM    [place]

## 2021-04-03 ENCOUNTER — Telehealth (HOSPITAL_COMMUNITY): Payer: Self-pay

## 2021-04-03 NOTE — Telephone Encounter (Signed)
Spoke with the patient, detailed instructions given. He stated that he would be here for his test. Asked to call back with any questions. S.Cloy Cozzens EMTP 

## 2021-04-04 ENCOUNTER — Ambulatory Visit (HOSPITAL_BASED_OUTPATIENT_CLINIC_OR_DEPARTMENT_OTHER): Payer: Medicare Other

## 2021-04-04 ENCOUNTER — Other Ambulatory Visit: Payer: Self-pay

## 2021-04-04 ENCOUNTER — Ambulatory Visit (HOSPITAL_COMMUNITY): Payer: Medicare Other | Attending: Cardiology

## 2021-04-04 DIAGNOSIS — R0609 Other forms of dyspnea: Secondary | ICD-10-CM | POA: Insufficient documentation

## 2021-04-04 LAB — MYOCARDIAL PERFUSION IMAGING
LV dias vol: 76 mL (ref 62–150)
LV sys vol: 34 mL
Nuc Stress EF: 55 %
Peak HR: 85 {beats}/min
Rest HR: 60 {beats}/min
Rest Nuclear Isotope Dose: 10.9 mCi
SDS: 0
SRS: 0
SSS: 0
ST Depression (mm): 0 mm
Stress Nuclear Isotope Dose: 30.3 mCi
TID: 1.02

## 2021-04-04 LAB — ECHOCARDIOGRAM COMPLETE
Area-P 1/2: 4.46 cm2
S' Lateral: 3.8 cm

## 2021-04-04 MED ORDER — REGADENOSON 0.4 MG/5ML IV SOLN
0.4000 mg | Freq: Once | INTRAVENOUS | Status: AC
  Administered 2021-04-04: 0.4 mg via INTRAVENOUS

## 2021-04-04 MED ORDER — TECHNETIUM TC 99M TETROFOSMIN IV KIT
30.3000 | PACK | Freq: Once | INTRAVENOUS | Status: AC | PRN
  Administered 2021-04-04: 30.3 via INTRAVENOUS
  Filled 2021-04-04: qty 31

## 2021-04-04 MED ORDER — TECHNETIUM TC 99M TETROFOSMIN IV KIT
10.9000 | PACK | Freq: Once | INTRAVENOUS | Status: AC | PRN
  Administered 2021-04-04: 10.9 via INTRAVENOUS
  Filled 2021-04-04: qty 11

## 2021-05-01 DIAGNOSIS — R1310 Dysphagia, unspecified: Secondary | ICD-10-CM | POA: Diagnosis not present

## 2021-05-01 DIAGNOSIS — M79674 Pain in right toe(s): Secondary | ICD-10-CM | POA: Diagnosis not present

## 2021-05-01 DIAGNOSIS — M7989 Other specified soft tissue disorders: Secondary | ICD-10-CM | POA: Diagnosis not present

## 2021-05-01 DIAGNOSIS — R1110 Vomiting, unspecified: Secondary | ICD-10-CM | POA: Diagnosis not present

## 2021-05-03 DIAGNOSIS — R1310 Dysphagia, unspecified: Secondary | ICD-10-CM | POA: Diagnosis not present

## 2021-05-03 DIAGNOSIS — K222 Esophageal obstruction: Secondary | ICD-10-CM | POA: Diagnosis not present

## 2021-05-03 DIAGNOSIS — K449 Diaphragmatic hernia without obstruction or gangrene: Secondary | ICD-10-CM | POA: Diagnosis not present

## 2021-05-03 DIAGNOSIS — J383 Other diseases of vocal cords: Secondary | ICD-10-CM | POA: Diagnosis not present

## 2021-05-03 DIAGNOSIS — K2980 Duodenitis without bleeding: Secondary | ICD-10-CM | POA: Diagnosis not present

## 2021-06-10 ENCOUNTER — Encounter: Payer: Self-pay | Admitting: Internal Medicine

## 2021-06-10 ENCOUNTER — Ambulatory Visit: Payer: Medicare Other | Admitting: Internal Medicine

## 2021-06-10 VITALS — BP 120/76 | HR 65 | Ht 69.0 in | Wt 256.0 lb

## 2021-06-10 DIAGNOSIS — R0600 Dyspnea, unspecified: Secondary | ICD-10-CM

## 2021-06-10 NOTE — Patient Instructions (Signed)

## 2021-06-10 NOTE — Progress Notes (Signed)
?Cardiology Office Note:   ? ?Date:  06/10/2021  ? ?ID:  Joseph Bates, DOB 1946/01/03, MRN [id number] ? ?PCP:  Lawerance Cruel, MD ?  ?[facility] Providers ?Cardiologist:  Janina Mayo, MD    ? ?Referring MD: Lawerance Cruel, MD  ? ?No chief complaint on file. ?Dyspnea on exertion ? ?History of Present Illness:   ? ?Joseph Bates is a 76 y.o. male with a hx of obstructive lung dx,  CKD, COVID19 infection end of 2021, former smoker for 40 years, prolonged DOE, referral from [facility] Dr. Harrington Challenger for [place] ? ?He states his DOE has not progressed. Prior to [place] he was very active, he now can't walk to the mailbox or pulling the trash can and he gets out of breath. It's been this way since his infection. He denies chest pain or squeezing. He plays golf , and has some challenges with walking up hill. He denies PND, orthopnea. Mild ankle swelling. He has asthma and he has been on symbicort for 15 years.  He was a former smoker, he smoked for 40 years. ? ?He had a stress test 15 years ago and went on the treadmill. He has no hx of catheterization. May have had an echo.  ? ?He has no hx of hypertension. No hx of sleep apnea  ? ?Family hx: father died at 26 from brain aneurysm. Mother passed at 66. ? ?CVD Risk/Equivalent: ?HLD- yes ?HTN- no ?PAD- No ?DMII -no ?Smoker-former ?Stroke-no ?Premature Family History- no ?  ? ?Labs ?1//18/2022 ? Crt 1.34 ?T bili 1.7 ?Normal AST/ALT ? ?TC 149 mg/dL ?LDL 84 mg/dL ? ?Interim Hx: ?He notes continues to have low exercise capacity. He was diagnosed with asthma symbicort. He has no wheezing. He is working on loosing weight and has lost 9 pounds. Otherwise, myoview and echo were normal. Blood pressure is normal. ? ? ?Past Medical History:  ?Diagnosis Date  ? Arthritis   ? Asthma   ? COPD (chronic obstructive pulmonary disease) ([place])   ? Dyspnea   ? only if he does not do his symbicort  ? GERD (gastroesophageal reflux disease)   ? occasionaly  ? History of kidney stones   ?  Hyperlipidemia   ? ? ?Past Surgical History:  ?Procedure Laterality Date  ? HERNIA REPAIR    ? INSERTION OF MESH N/A 09/29/2017  ? Procedure: INSERTION OF MESH;  Surgeon: Ralene Ok, MD;  Location: [place];  Service: General;  Laterality: N/A;  ? JOINT REPLACEMENT Bilateral   ? knee  ? UMBILICAL HERNIA REPAIR N/A 09/29/2017  ? Procedure: LAPAROSCOPIC UMBILICAL HERNIA WITH MESH;  Surgeon: Ralene Ok, MD;  Location: [facility];  Service: General;  Laterality: N/A;  ? vocal cord surgery    ? ? ?Current Medications: ?Current Meds  ?Medication Sig  ? albuterol (PROVENTIL HFA;VENTOLIN HFA) 108 (90 Base) MCG/ACT inhaler Every 4-6 hours prn. (Patient taking differently: Every 4-6 hours prn.)  ? atorvastatin (LIPITOR) 10 MG tablet Take 10 mg by mouth at bedtime.  ? budesonide-formoterol (SYMBICORT) 160-4.5 MCG/ACT inhaler Take 2 puffs first thing in am and then another 2 puffs about 12 hours later. (Patient taking differently: Inhale 2 puffs into the lungs 2 (two) times daily.)  ? ibuprofen (ADVIL,MOTRIN) 200 MG tablet Take 200 mg by mouth every 6 (six) hours as needed for headache or moderate pain.  ?  ? ?Allergies:   Naproxen and Naproxen sodium  ? ?Social History  ? ?Socioeconomic History  ?  Marital status: Married  ?  Spouse name: Not on file  ? Number of children: Not on file  ? Years of education: Not on file  ? Highest education level: Not on file  ?Occupational History  ? Occupation: Ambulance person ROOM WORKER AT [employer]  ?Tobacco Use  ? Smoking status: Former  ?  Packs/day: 2.00  ?  Years: 40.00  ?  Pack years: 80.00  ?  Types: Cigarettes  ?  Quit date: 12/14/2005  ?  Years since quitting: 15.4  ? Smokeless tobacco: Never  ?Vaping Use  ? Vaping Use: Never used  ?Substance and Sexual Activity  ? Alcohol use: Yes  ?  Comment: rarely  ? Drug use: Never  ? Sexual activity: Not on file  ?Other Topics Concern  ? Not on file  ?Social History Narrative  ? Not on file  ? ?Social Determinants of Health  ? ?Financial  Resource Strain: Not on file  ?Food Insecurity: Not on file  ?Transportation Needs: Not on file  ?Physical Activity: Not on file  ?Stress: Not on file  ?Social Connections: Not on file  ?  ? ?Family History: ?The patient's family history includes Ovarian cancer in his maternal grandmother. ? ?ROS:   ?Please see the history of present illness.    ? All other systems reviewed and are negative. ? ?EKGs/Labs/Other Studies Reviewed:   ? ?The following studies were reviewed today: ? ? ?EKG:  EKG is  ordered today.  The ekg ordered today demonstrates  ? ?03/18/2021 EKG-NSR, no ischemic changes ? ?Recent Labs: ?No results found for requested labs within last 8760 hours.  ?Recent Lipid Panel ?No results found for: CHOL, TRIG, HDL, CHOLHDL, VLDL, LDLCALC, LDLDIRECT ? ? ?Risk Assessment/Calculations:   ?  ? ? ?Physical Exam:   ? ?VS:  ? ?Vitals:  ? 06/10/21 0850  ?BP: 120/76  ?Pulse: 65  ?SpO2: 92%  ? ? ? ?Wt Readings from Last 3 Encounters:  ?06/10/21 256 lb (116.1 kg)  ?04/04/21 265 lb (120.2 kg)  ?03/18/21 265 lb (120.2 kg)  ?  ? ?GEN:  Well nourished, well developed in no acute distress ?HEENT: Normal ?LYMPHATICS: No lymphadenopathy ?CARDIAC: RRR, no murmurs, rubs, gallops ?RESPIRATORY:  Clear to auscultation without rales, wheezing or rhonchi  ?ABDOMEN: Soft, non-tender, non-distended ?MUSCULOSKELETAL:  No edema; No deformity  ?SKIN: Warm and dry ?NEUROLOGIC:  Alert and oriented x 3 ?PSYCHIATRIC:  Normal affect  ? ?ASSESSMENT:   ? ?#DOE: Ddx includes coronary dx vs decompensated CHF vs long COVID. Valvular dx is also on the differential. He denies chest pressure, but does have risk factors including age and former smoker. For this I recommended a myoview and echo. His SPECT did not show inducible ischemia or scar. His echo was normal. DOE may be related to deconditioning/long COVID. We discussed as the weather improves to boost his physical activity and conditioning as well as continue with weight loss.  ? ?#HLD: continue  atovastatin 10 mg daily ?-LDL at goal < 100 mg/dL ? ?PLAN:   ? ?In order of problems listed above: ? ?Normal cardiac studies. Follow up as needed ? ?   ? ?Shared Decision Making/Informed Consent ?The risks [chest pain, shortness of breath, cardiac arrhythmias, dizziness, blood pressure fluctuations, myocardial infarction, stroke/transient ischemic attack, nausea, vomiting, allergic reaction, radiation exposure, metallic taste sensation and life-threatening complications (estimated to be 1 in 10,000)], benefits (risk stratification, diagnosing coronary artery disease, treatment guidance) and alternatives of a nuclear stress test were discussed  in detail with Mr. Cham and he agrees to proceed.  ? ? ?Medication Adjustments/Labs and Tests Ordered: ?Current medicines are reviewed at length with the patient today.  Concerns regarding medicines are outlined above.  ?No orders of the defined types were placed in this encounter. ? ?No orders of the defined types were placed in this encounter. ? ? ?Patient Instructions  ?Medication Instructions:  ?No Changes In Medications at this time.  ?*If you need a refill on your cardiac medications before your next appointment, please call your pharmacy* ? ?Follow-Up: ?At [facility], you and your health needs are our priority.  As part of our continuing mission to provide you with exceptional heart care, we have created designated Provider Care Teams.  These Care Teams include your primary Cardiologist (physician) and Advanced Practice Providers (APPs -  Physician Assistants and Nurse Practitioners) who all work together to provide you with the care you need, when you need it. ? ?Your next appointment:   ?AS NEEDED  ? ?The format for your next appointment:   ?In Person ? ?Provider:   ?Janina Mayo, MD   ?  ? ?Signed, ?Janina Mayo, MD  ?06/10/2021 9:13 AM    ?[place] ?

## 2021-06-18 DIAGNOSIS — H26491 Other secondary cataract, right eye: Secondary | ICD-10-CM | POA: Diagnosis not present

## 2021-06-18 DIAGNOSIS — H43813 Vitreous degeneration, bilateral: Secondary | ICD-10-CM | POA: Diagnosis not present

## 2021-09-25 DIAGNOSIS — J383 Other diseases of vocal cords: Secondary | ICD-10-CM | POA: Diagnosis not present

## 2021-09-25 DIAGNOSIS — R490 Dysphonia: Secondary | ICD-10-CM | POA: Diagnosis not present

## 2021-09-25 DIAGNOSIS — Z87891 Personal history of nicotine dependence: Secondary | ICD-10-CM | POA: Diagnosis not present

## 2021-12-24 DIAGNOSIS — J383 Other diseases of vocal cords: Secondary | ICD-10-CM | POA: Diagnosis not present

## 2021-12-24 DIAGNOSIS — R490 Dysphonia: Secondary | ICD-10-CM | POA: Diagnosis not present

## 2021-12-24 DIAGNOSIS — J387 Other diseases of larynx: Secondary | ICD-10-CM | POA: Diagnosis not present

## 2021-12-24 DIAGNOSIS — R1314 Dysphagia, pharyngoesophageal phase: Secondary | ICD-10-CM | POA: Diagnosis not present

## 2022-01-13 DIAGNOSIS — M62830 Muscle spasm of back: Secondary | ICD-10-CM | POA: Diagnosis not present

## 2022-01-13 DIAGNOSIS — S39012A Strain of muscle, fascia and tendon of lower back, initial encounter: Secondary | ICD-10-CM | POA: Diagnosis not present

## 2022-01-13 DIAGNOSIS — M9904 Segmental and somatic dysfunction of sacral region: Secondary | ICD-10-CM | POA: Diagnosis not present

## 2022-01-13 DIAGNOSIS — M9903 Segmental and somatic dysfunction of lumbar region: Secondary | ICD-10-CM | POA: Diagnosis not present

## 2022-01-14 DIAGNOSIS — S39012A Strain of muscle, fascia and tendon of lower back, initial encounter: Secondary | ICD-10-CM | POA: Diagnosis not present

## 2022-01-14 DIAGNOSIS — M9903 Segmental and somatic dysfunction of lumbar region: Secondary | ICD-10-CM | POA: Diagnosis not present

## 2022-01-14 DIAGNOSIS — M62830 Muscle spasm of back: Secondary | ICD-10-CM | POA: Diagnosis not present

## 2022-01-14 DIAGNOSIS — M9904 Segmental and somatic dysfunction of sacral region: Secondary | ICD-10-CM | POA: Diagnosis not present

## 2022-01-15 DIAGNOSIS — M9903 Segmental and somatic dysfunction of lumbar region: Secondary | ICD-10-CM | POA: Diagnosis not present

## 2022-01-15 DIAGNOSIS — S39012A Strain of muscle, fascia and tendon of lower back, initial encounter: Secondary | ICD-10-CM | POA: Diagnosis not present

## 2022-01-15 DIAGNOSIS — M9904 Segmental and somatic dysfunction of sacral region: Secondary | ICD-10-CM | POA: Diagnosis not present

## 2022-01-15 DIAGNOSIS — M62830 Muscle spasm of back: Secondary | ICD-10-CM | POA: Diagnosis not present

## 2022-01-20 DIAGNOSIS — S39012A Strain of muscle, fascia and tendon of lower back, initial encounter: Secondary | ICD-10-CM | POA: Diagnosis not present

## 2022-01-20 DIAGNOSIS — M9904 Segmental and somatic dysfunction of sacral region: Secondary | ICD-10-CM | POA: Diagnosis not present

## 2022-01-20 DIAGNOSIS — M62830 Muscle spasm of back: Secondary | ICD-10-CM | POA: Diagnosis not present

## 2022-01-20 DIAGNOSIS — M9903 Segmental and somatic dysfunction of lumbar region: Secondary | ICD-10-CM | POA: Diagnosis not present

## 2022-01-21 DIAGNOSIS — S39012A Strain of muscle, fascia and tendon of lower back, initial encounter: Secondary | ICD-10-CM | POA: Diagnosis not present

## 2022-01-21 DIAGNOSIS — M62830 Muscle spasm of back: Secondary | ICD-10-CM | POA: Diagnosis not present

## 2022-01-21 DIAGNOSIS — M9903 Segmental and somatic dysfunction of lumbar region: Secondary | ICD-10-CM | POA: Diagnosis not present

## 2022-01-21 DIAGNOSIS — M9904 Segmental and somatic dysfunction of sacral region: Secondary | ICD-10-CM | POA: Diagnosis not present

## 2022-01-22 DIAGNOSIS — S39012A Strain of muscle, fascia and tendon of lower back, initial encounter: Secondary | ICD-10-CM | POA: Diagnosis not present

## 2022-01-22 DIAGNOSIS — M62830 Muscle spasm of back: Secondary | ICD-10-CM | POA: Diagnosis not present

## 2022-01-22 DIAGNOSIS — M9903 Segmental and somatic dysfunction of lumbar region: Secondary | ICD-10-CM | POA: Diagnosis not present

## 2022-01-22 DIAGNOSIS — M9904 Segmental and somatic dysfunction of sacral region: Secondary | ICD-10-CM | POA: Diagnosis not present

## 2022-01-27 DIAGNOSIS — M9904 Segmental and somatic dysfunction of sacral region: Secondary | ICD-10-CM | POA: Diagnosis not present

## 2022-01-27 DIAGNOSIS — E7800 Pure hypercholesterolemia, unspecified: Secondary | ICD-10-CM | POA: Diagnosis not present

## 2022-01-27 DIAGNOSIS — S39012A Strain of muscle, fascia and tendon of lower back, initial encounter: Secondary | ICD-10-CM | POA: Diagnosis not present

## 2022-01-27 DIAGNOSIS — M62830 Muscle spasm of back: Secondary | ICD-10-CM | POA: Diagnosis not present

## 2022-01-27 DIAGNOSIS — M9903 Segmental and somatic dysfunction of lumbar region: Secondary | ICD-10-CM | POA: Diagnosis not present

## 2022-01-27 DIAGNOSIS — N183 Chronic kidney disease, stage 3 unspecified: Secondary | ICD-10-CM | POA: Diagnosis not present

## 2022-01-28 DIAGNOSIS — M9903 Segmental and somatic dysfunction of lumbar region: Secondary | ICD-10-CM | POA: Diagnosis not present

## 2022-01-28 DIAGNOSIS — M9904 Segmental and somatic dysfunction of sacral region: Secondary | ICD-10-CM | POA: Diagnosis not present

## 2022-01-28 DIAGNOSIS — S39012A Strain of muscle, fascia and tendon of lower back, initial encounter: Secondary | ICD-10-CM | POA: Diagnosis not present

## 2022-01-28 DIAGNOSIS — M62830 Muscle spasm of back: Secondary | ICD-10-CM | POA: Diagnosis not present

## 2022-02-03 DIAGNOSIS — M9904 Segmental and somatic dysfunction of sacral region: Secondary | ICD-10-CM | POA: Diagnosis not present

## 2022-02-03 DIAGNOSIS — M9903 Segmental and somatic dysfunction of lumbar region: Secondary | ICD-10-CM | POA: Diagnosis not present

## 2022-02-03 DIAGNOSIS — M62830 Muscle spasm of back: Secondary | ICD-10-CM | POA: Diagnosis not present

## 2022-02-03 DIAGNOSIS — S39012A Strain of muscle, fascia and tendon of lower back, initial encounter: Secondary | ICD-10-CM | POA: Diagnosis not present

## 2022-02-04 DIAGNOSIS — S39012A Strain of muscle, fascia and tendon of lower back, initial encounter: Secondary | ICD-10-CM | POA: Diagnosis not present

## 2022-02-04 DIAGNOSIS — M9903 Segmental and somatic dysfunction of lumbar region: Secondary | ICD-10-CM | POA: Diagnosis not present

## 2022-02-04 DIAGNOSIS — M9904 Segmental and somatic dysfunction of sacral region: Secondary | ICD-10-CM | POA: Diagnosis not present

## 2022-02-04 DIAGNOSIS — M62830 Muscle spasm of back: Secondary | ICD-10-CM | POA: Diagnosis not present

## 2022-02-05 DIAGNOSIS — E7800 Pure hypercholesterolemia, unspecified: Secondary | ICD-10-CM | POA: Diagnosis not present

## 2022-02-05 DIAGNOSIS — J4540 Moderate persistent asthma, uncomplicated: Secondary | ICD-10-CM | POA: Diagnosis not present

## 2022-02-05 DIAGNOSIS — Z0000 Encounter for general adult medical examination without abnormal findings: Secondary | ICD-10-CM | POA: Diagnosis not present

## 2022-02-05 DIAGNOSIS — N183 Chronic kidney disease, stage 3 unspecified: Secondary | ICD-10-CM | POA: Diagnosis not present

## 2022-02-24 DIAGNOSIS — M9904 Segmental and somatic dysfunction of sacral region: Secondary | ICD-10-CM | POA: Diagnosis not present

## 2022-02-24 DIAGNOSIS — M62830 Muscle spasm of back: Secondary | ICD-10-CM | POA: Diagnosis not present

## 2022-02-24 DIAGNOSIS — S39012A Strain of muscle, fascia and tendon of lower back, initial encounter: Secondary | ICD-10-CM | POA: Diagnosis not present

## 2022-02-24 DIAGNOSIS — M9903 Segmental and somatic dysfunction of lumbar region: Secondary | ICD-10-CM | POA: Diagnosis not present

## 2022-02-26 DIAGNOSIS — M9903 Segmental and somatic dysfunction of lumbar region: Secondary | ICD-10-CM | POA: Diagnosis not present

## 2022-02-26 DIAGNOSIS — M9904 Segmental and somatic dysfunction of sacral region: Secondary | ICD-10-CM | POA: Diagnosis not present

## 2022-02-26 DIAGNOSIS — M62830 Muscle spasm of back: Secondary | ICD-10-CM | POA: Diagnosis not present

## 2022-02-26 DIAGNOSIS — S39012A Strain of muscle, fascia and tendon of lower back, initial encounter: Secondary | ICD-10-CM | POA: Diagnosis not present

## 2022-03-12 DIAGNOSIS — M9904 Segmental and somatic dysfunction of sacral region: Secondary | ICD-10-CM | POA: Diagnosis not present

## 2022-03-12 DIAGNOSIS — M62830 Muscle spasm of back: Secondary | ICD-10-CM | POA: Diagnosis not present

## 2022-03-12 DIAGNOSIS — S39012A Strain of muscle, fascia and tendon of lower back, initial encounter: Secondary | ICD-10-CM | POA: Diagnosis not present

## 2022-03-12 DIAGNOSIS — M9903 Segmental and somatic dysfunction of lumbar region: Secondary | ICD-10-CM | POA: Diagnosis not present

## 2022-03-19 DIAGNOSIS — M9904 Segmental and somatic dysfunction of sacral region: Secondary | ICD-10-CM | POA: Diagnosis not present

## 2022-03-19 DIAGNOSIS — M9903 Segmental and somatic dysfunction of lumbar region: Secondary | ICD-10-CM | POA: Diagnosis not present

## 2022-03-19 DIAGNOSIS — M62830 Muscle spasm of back: Secondary | ICD-10-CM | POA: Diagnosis not present

## 2022-03-19 DIAGNOSIS — S39012A Strain of muscle, fascia and tendon of lower back, initial encounter: Secondary | ICD-10-CM | POA: Diagnosis not present

## 2022-04-02 DIAGNOSIS — M9903 Segmental and somatic dysfunction of lumbar region: Secondary | ICD-10-CM | POA: Diagnosis not present

## 2022-04-02 DIAGNOSIS — S39012A Strain of muscle, fascia and tendon of lower back, initial encounter: Secondary | ICD-10-CM | POA: Diagnosis not present

## 2022-04-02 DIAGNOSIS — M62830 Muscle spasm of back: Secondary | ICD-10-CM | POA: Diagnosis not present

## 2022-04-02 DIAGNOSIS — M9904 Segmental and somatic dysfunction of sacral region: Secondary | ICD-10-CM | POA: Diagnosis not present

## 2022-04-14 DIAGNOSIS — R1312 Dysphagia, oropharyngeal phase: Secondary | ICD-10-CM | POA: Diagnosis not present

## 2022-04-14 DIAGNOSIS — J3800 Paralysis of vocal cords and larynx, unspecified: Secondary | ICD-10-CM | POA: Diagnosis not present

## 2022-04-14 DIAGNOSIS — R6339 Other feeding difficulties: Secondary | ICD-10-CM | POA: Diagnosis not present

## 2022-04-14 DIAGNOSIS — J383 Other diseases of vocal cords: Secondary | ICD-10-CM | POA: Diagnosis not present

## 2022-04-14 DIAGNOSIS — R1310 Dysphagia, unspecified: Secondary | ICD-10-CM | POA: Diagnosis not present

## 2022-04-14 DIAGNOSIS — L539 Erythematous condition, unspecified: Secondary | ICD-10-CM | POA: Diagnosis not present

## 2022-04-14 DIAGNOSIS — J384 Edema of larynx: Secondary | ICD-10-CM | POA: Diagnosis not present

## 2022-04-14 DIAGNOSIS — R490 Dysphonia: Secondary | ICD-10-CM | POA: Diagnosis not present

## 2022-04-28 DIAGNOSIS — S39012A Strain of muscle, fascia and tendon of lower back, initial encounter: Secondary | ICD-10-CM | POA: Diagnosis not present

## 2022-04-28 DIAGNOSIS — M9904 Segmental and somatic dysfunction of sacral region: Secondary | ICD-10-CM | POA: Diagnosis not present

## 2022-04-28 DIAGNOSIS — M62830 Muscle spasm of back: Secondary | ICD-10-CM | POA: Diagnosis not present

## 2022-04-28 DIAGNOSIS — M9903 Segmental and somatic dysfunction of lumbar region: Secondary | ICD-10-CM | POA: Diagnosis not present

## 2022-05-26 DIAGNOSIS — M9904 Segmental and somatic dysfunction of sacral region: Secondary | ICD-10-CM | POA: Diagnosis not present

## 2022-05-26 DIAGNOSIS — M62830 Muscle spasm of back: Secondary | ICD-10-CM | POA: Diagnosis not present

## 2022-05-26 DIAGNOSIS — S39012A Strain of muscle, fascia and tendon of lower back, initial encounter: Secondary | ICD-10-CM | POA: Diagnosis not present

## 2022-05-26 DIAGNOSIS — M9903 Segmental and somatic dysfunction of lumbar region: Secondary | ICD-10-CM | POA: Diagnosis not present

## 2022-07-07 DIAGNOSIS — M9904 Segmental and somatic dysfunction of sacral region: Secondary | ICD-10-CM | POA: Diagnosis not present

## 2022-07-07 DIAGNOSIS — S39012A Strain of muscle, fascia and tendon of lower back, initial encounter: Secondary | ICD-10-CM | POA: Diagnosis not present

## 2022-07-07 DIAGNOSIS — M9903 Segmental and somatic dysfunction of lumbar region: Secondary | ICD-10-CM | POA: Diagnosis not present

## 2022-07-07 DIAGNOSIS — M62830 Muscle spasm of back: Secondary | ICD-10-CM | POA: Diagnosis not present

## 2022-08-05 DIAGNOSIS — M62830 Muscle spasm of back: Secondary | ICD-10-CM | POA: Diagnosis not present

## 2022-08-05 DIAGNOSIS — M9903 Segmental and somatic dysfunction of lumbar region: Secondary | ICD-10-CM | POA: Diagnosis not present

## 2022-08-05 DIAGNOSIS — S39012A Strain of muscle, fascia and tendon of lower back, initial encounter: Secondary | ICD-10-CM | POA: Diagnosis not present

## 2022-08-05 DIAGNOSIS — M9904 Segmental and somatic dysfunction of sacral region: Secondary | ICD-10-CM | POA: Diagnosis not present

## 2022-09-02 DIAGNOSIS — M62830 Muscle spasm of back: Secondary | ICD-10-CM | POA: Diagnosis not present

## 2022-09-02 DIAGNOSIS — M9903 Segmental and somatic dysfunction of lumbar region: Secondary | ICD-10-CM | POA: Diagnosis not present

## 2022-09-02 DIAGNOSIS — M9904 Segmental and somatic dysfunction of sacral region: Secondary | ICD-10-CM | POA: Diagnosis not present

## 2022-09-02 DIAGNOSIS — S39012A Strain of muscle, fascia and tendon of lower back, initial encounter: Secondary | ICD-10-CM | POA: Diagnosis not present

## 2022-09-30 DIAGNOSIS — M62830 Muscle spasm of back: Secondary | ICD-10-CM | POA: Diagnosis not present

## 2022-09-30 DIAGNOSIS — S39012A Strain of muscle, fascia and tendon of lower back, initial encounter: Secondary | ICD-10-CM | POA: Diagnosis not present

## 2022-09-30 DIAGNOSIS — M9903 Segmental and somatic dysfunction of lumbar region: Secondary | ICD-10-CM | POA: Diagnosis not present

## 2022-09-30 DIAGNOSIS — M9904 Segmental and somatic dysfunction of sacral region: Secondary | ICD-10-CM | POA: Diagnosis not present

## 2022-10-28 DIAGNOSIS — M62830 Muscle spasm of back: Secondary | ICD-10-CM | POA: Diagnosis not present

## 2022-10-28 DIAGNOSIS — M9903 Segmental and somatic dysfunction of lumbar region: Secondary | ICD-10-CM | POA: Diagnosis not present

## 2022-10-28 DIAGNOSIS — S39012A Strain of muscle, fascia and tendon of lower back, initial encounter: Secondary | ICD-10-CM | POA: Diagnosis not present

## 2022-10-28 DIAGNOSIS — M9904 Segmental and somatic dysfunction of sacral region: Secondary | ICD-10-CM | POA: Diagnosis not present

## 2022-11-21 DIAGNOSIS — U071 COVID-19: Secondary | ICD-10-CM | POA: Diagnosis not present

## 2023-01-27 DIAGNOSIS — Z79899 Other long term (current) drug therapy: Secondary | ICD-10-CM | POA: Diagnosis not present

## 2023-01-27 DIAGNOSIS — E7800 Pure hypercholesterolemia, unspecified: Secondary | ICD-10-CM | POA: Diagnosis not present

## 2023-02-09 DIAGNOSIS — N1831 Chronic kidney disease, stage 3a: Secondary | ICD-10-CM | POA: Diagnosis not present

## 2023-02-09 DIAGNOSIS — Z0000 Encounter for general adult medical examination without abnormal findings: Secondary | ICD-10-CM | POA: Diagnosis not present

## 2023-02-09 DIAGNOSIS — E7800 Pure hypercholesterolemia, unspecified: Secondary | ICD-10-CM | POA: Diagnosis not present

## 2023-02-09 DIAGNOSIS — J4540 Moderate persistent asthma, uncomplicated: Secondary | ICD-10-CM | POA: Diagnosis not present

## 2023-02-09 DIAGNOSIS — I700 Atherosclerosis of aorta: Secondary | ICD-10-CM | POA: Diagnosis not present

## 2023-02-25 DIAGNOSIS — H524 Presbyopia: Secondary | ICD-10-CM | POA: Diagnosis not present

## 2023-02-25 DIAGNOSIS — H52223 Regular astigmatism, bilateral: Secondary | ICD-10-CM | POA: Diagnosis not present

## 2023-02-25 DIAGNOSIS — H5213 Myopia, bilateral: Secondary | ICD-10-CM | POA: Diagnosis not present

## 2023-02-25 DIAGNOSIS — H26491 Other secondary cataract, right eye: Secondary | ICD-10-CM | POA: Diagnosis not present

## 2023-07-30 DIAGNOSIS — M25571 Pain in right ankle and joints of right foot: Secondary | ICD-10-CM | POA: Diagnosis not present

## 2023-07-30 DIAGNOSIS — L03115 Cellulitis of right lower limb: Secondary | ICD-10-CM | POA: Diagnosis not present

## 2023-08-01 DIAGNOSIS — L03115 Cellulitis of right lower limb: Secondary | ICD-10-CM | POA: Diagnosis not present

## 2023-08-03 DIAGNOSIS — L03115 Cellulitis of right lower limb: Secondary | ICD-10-CM | POA: Diagnosis not present

## 2023-08-05 ENCOUNTER — Encounter (HOSPITAL_BASED_OUTPATIENT_CLINIC_OR_DEPARTMENT_OTHER): Payer: Self-pay | Admitting: *Deleted

## 2023-08-05 ENCOUNTER — Other Ambulatory Visit: Payer: Self-pay

## 2023-08-05 ENCOUNTER — Inpatient Hospital Stay (HOSPITAL_BASED_OUTPATIENT_CLINIC_OR_DEPARTMENT_OTHER)
Admission: EM | Admit: 2023-08-05 | Discharge: 2023-08-08 | DRG: 439 | Disposition: A | Discharge status: Home / Self Care | Attending: Internal Medicine | Admitting: Internal Medicine

## 2023-08-05 ENCOUNTER — Inpatient Hospital Stay (HOSPITAL_COMMUNITY)

## 2023-08-05 ENCOUNTER — Emergency Department (HOSPITAL_BASED_OUTPATIENT_CLINIC_OR_DEPARTMENT_OTHER)

## 2023-08-05 DIAGNOSIS — I878 Other specified disorders of veins: Secondary | ICD-10-CM | POA: Diagnosis present

## 2023-08-05 DIAGNOSIS — K449 Diaphragmatic hernia without obstruction or gangrene: Secondary | ICD-10-CM | POA: Diagnosis not present

## 2023-08-05 DIAGNOSIS — Z888 Allergy status to other drugs, medicaments and biological substances status: Secondary | ICD-10-CM

## 2023-08-05 DIAGNOSIS — E66812 Obesity, class 2: Secondary | ICD-10-CM | POA: Diagnosis present

## 2023-08-05 DIAGNOSIS — K8500 Idiopathic acute pancreatitis without necrosis or infection: Principal | ICD-10-CM | POA: Diagnosis present

## 2023-08-05 DIAGNOSIS — Z7951 Long term (current) use of inhaled steroids: Secondary | ICD-10-CM

## 2023-08-05 DIAGNOSIS — E785 Hyperlipidemia, unspecified: Secondary | ICD-10-CM | POA: Diagnosis present

## 2023-08-05 DIAGNOSIS — R932 Abnormal findings on diagnostic imaging of liver and biliary tract: Secondary | ICD-10-CM | POA: Diagnosis not present

## 2023-08-05 DIAGNOSIS — K862 Cyst of pancreas: Secondary | ICD-10-CM | POA: Diagnosis present

## 2023-08-05 DIAGNOSIS — K8689 Other specified diseases of pancreas: Secondary | ICD-10-CM | POA: Diagnosis not present

## 2023-08-05 DIAGNOSIS — L03115 Cellulitis of right lower limb: Secondary | ICD-10-CM | POA: Diagnosis not present

## 2023-08-05 DIAGNOSIS — Z79899 Other long term (current) drug therapy: Secondary | ICD-10-CM | POA: Diagnosis not present

## 2023-08-05 DIAGNOSIS — Z87442 Personal history of urinary calculi: Secondary | ICD-10-CM | POA: Diagnosis not present

## 2023-08-05 DIAGNOSIS — J4489 Other specified chronic obstructive pulmonary disease: Secondary | ICD-10-CM | POA: Diagnosis present

## 2023-08-05 DIAGNOSIS — K859 Acute pancreatitis without necrosis or infection, unspecified: Secondary | ICD-10-CM | POA: Diagnosis not present

## 2023-08-05 DIAGNOSIS — N281 Cyst of kidney, acquired: Secondary | ICD-10-CM | POA: Diagnosis not present

## 2023-08-05 DIAGNOSIS — R933 Abnormal findings on diagnostic imaging of other parts of digestive tract: Secondary | ICD-10-CM | POA: Diagnosis not present

## 2023-08-05 DIAGNOSIS — Z87891 Personal history of nicotine dependence: Secondary | ICD-10-CM | POA: Diagnosis not present

## 2023-08-05 DIAGNOSIS — K219 Gastro-esophageal reflux disease without esophagitis: Secondary | ICD-10-CM | POA: Diagnosis present

## 2023-08-05 DIAGNOSIS — Z8041 Family history of malignant neoplasm of ovary: Secondary | ICD-10-CM | POA: Diagnosis not present

## 2023-08-05 DIAGNOSIS — K5730 Diverticulosis of large intestine without perforation or abscess without bleeding: Secondary | ICD-10-CM | POA: Diagnosis not present

## 2023-08-05 DIAGNOSIS — R109 Unspecified abdominal pain: Secondary | ICD-10-CM | POA: Diagnosis not present

## 2023-08-05 DIAGNOSIS — K7689 Other specified diseases of liver: Secondary | ICD-10-CM | POA: Diagnosis not present

## 2023-08-05 DIAGNOSIS — Z6837 Body mass index (BMI) 37.0-37.9, adult: Secondary | ICD-10-CM | POA: Diagnosis not present

## 2023-08-05 LAB — COMPREHENSIVE METABOLIC PANEL WITH GFR
ALT: 33 U/L (ref 0–44)
AST: 30 U/L (ref 15–41)
Albumin: 3.7 g/dL (ref 3.5–5.0)
Alkaline Phosphatase: 82 U/L (ref 38–126)
Anion gap: 13 (ref 5–15)
BUN: 23 mg/dL (ref 8–23)
CO2: 22 mmol/L (ref 22–32)
Calcium: 10.5 mg/dL — ABNORMAL HIGH (ref 8.9–10.3)
Chloride: 106 mmol/L (ref 98–111)
Creatinine, Ser: 1.33 mg/dL — ABNORMAL HIGH (ref 0.61–1.24)
GFR, Estimated: 55 mL/min — ABNORMAL LOW (ref >60)
Glucose, Bld: 162 mg/dL — ABNORMAL HIGH (ref 70–99)
Potassium: 4.3 mmol/L (ref 3.5–5.1)
Sodium: 141 mmol/L (ref 135–145)
Total Bilirubin: 1 mg/dL (ref 0.0–1.2)
Total Protein: 7.5 g/dL (ref 6.5–8.1)

## 2023-08-05 LAB — URINALYSIS, ROUTINE W REFLEX MICROSCOPIC
Bacteria, UA: NONE SEEN
Bilirubin Urine: NEGATIVE
Glucose, UA: NEGATIVE mg/dL
Hgb urine dipstick: NEGATIVE
Ketones, ur: NEGATIVE mg/dL
Leukocytes,Ua: NEGATIVE
Nitrite: NEGATIVE
Protein, ur: 30 mg/dL — AB
Specific Gravity, Urine: 1.026 (ref 1.005–1.030)
pH: 5 (ref 5.0–8.0)

## 2023-08-05 LAB — LIPASE, BLOOD: Lipase: >3000 U/L — ABNORMAL HIGH (ref 11–51)

## 2023-08-05 LAB — CBC
HCT: 50.1 % (ref 39.0–52.0)
Hemoglobin: 16 g/dL (ref 13.0–17.0)
MCH: 28.2 pg (ref 26.0–34.0)
MCHC: 31.9 g/dL (ref 30.0–36.0)
MCV: 88.2 fL (ref 80.0–100.0)
Platelets: 191 10*3/uL (ref 150–400)
RBC: 5.68 MIL/uL (ref 4.22–5.81)
RDW: 13.3 % (ref 11.5–15.5)
WBC: 10.4 10*3/uL (ref 4.0–10.5)
nRBC: 0 % (ref 0.0–0.2)

## 2023-08-05 LAB — TRIGLYCERIDES: Triglycerides: 70 mg/dL (ref <150)

## 2023-08-05 MED ORDER — OXYCODONE HCL 5 MG PO TABS
5.0000 mg | ORAL_TABLET | ORAL | Status: DC | PRN
  Filled 2023-08-05: qty 1

## 2023-08-05 MED ORDER — ONDANSETRON HCL 4 MG PO TABS
4.0000 mg | ORAL_TABLET | Freq: Four times a day (QID) | ORAL | Status: DC | PRN
  Filled 2023-08-05: qty 1

## 2023-08-05 MED ORDER — ONDANSETRON HCL 4 MG/2ML IJ SOLN
4.0000 mg | Freq: Once | INTRAMUSCULAR | Status: AC
  Administered 2023-08-05: 4 mg via INTRAVENOUS
  Filled 2023-08-05: qty 2

## 2023-08-05 MED ORDER — LORAZEPAM 1 MG PO TABS: 1.0000 mg | ORAL_TABLET | Freq: Once | ORAL | Status: DC

## 2023-08-05 MED ORDER — SODIUM CHLORIDE 0.9 % IV BOLUS
1000.0000 mL | Freq: Once | INTRAVENOUS | Status: AC
  Administered 2023-08-05: 1000 mL via INTRAVENOUS

## 2023-08-05 MED ORDER — CEPHALEXIN 500 MG PO CAPS
500.0000 mg | ORAL_CAPSULE | Freq: Four times a day (QID) | ORAL | Status: DC
  Administered 2023-08-05 – 2023-08-08 (×12): 500 mg via ORAL
  Filled 2023-08-05 (×13): qty 1

## 2023-08-05 MED ORDER — HYDRALAZINE HCL 20 MG/ML IJ SOLN
10.0000 mg | Freq: Once | INTRAMUSCULAR | Status: AC
  Administered 2023-08-05: 10 mg via INTRAVENOUS
  Filled 2023-08-05: qty 1

## 2023-08-05 MED ORDER — ATORVASTATIN CALCIUM 10 MG PO TABS
10.0000 mg | ORAL_TABLET | Freq: Every day | ORAL | Status: DC
  Administered 2023-08-05 – 2023-08-08 (×4): 10 mg via ORAL
  Filled 2023-08-05 (×4): qty 1

## 2023-08-05 MED ORDER — HYDROMORPHONE HCL 1 MG/ML IJ SOLN
0.5000 mg | INTRAMUSCULAR | Status: DC | PRN
  Administered 2023-08-05 (×2): 1 mg via INTRAVENOUS
  Filled 2023-08-05 (×2): qty 1

## 2023-08-05 MED ORDER — ACETAMINOPHEN 325 MG PO TABS: 650.0000 mg | ORAL_TABLET | Freq: Four times a day (QID) | ORAL | Status: DC | PRN

## 2023-08-05 MED ORDER — ENOXAPARIN SODIUM 40 MG/0.4ML IJ SOSY
40.0000 mg | PREFILLED_SYRINGE | INTRAMUSCULAR | Status: DC
  Administered 2023-08-05 – 2023-08-07 (×3): 40 mg via SUBCUTANEOUS
  Filled 2023-08-05 (×3): qty 0.4

## 2023-08-05 MED ORDER — ACETAMINOPHEN 650 MG RE SUPP: 650.0000 mg | Freq: Four times a day (QID) | RECTAL | Status: DC | PRN

## 2023-08-05 MED ORDER — FENTANYL CITRATE PF 50 MCG/ML IJ SOSY
50.0000 ug | PREFILLED_SYRINGE | Freq: Once | INTRAMUSCULAR | Status: AC
  Administered 2023-08-05: 50 ug via INTRAVENOUS
  Filled 2023-08-05: qty 1

## 2023-08-05 MED ORDER — GADOBUTROL 1 MMOL/ML IV SOLN
10.0000 mL | Freq: Once | INTRAVENOUS | Status: AC | PRN
  Administered 2023-08-05: 10 mL via INTRAVENOUS

## 2023-08-05 MED ORDER — DOXYCYCLINE HYCLATE 100 MG PO TABS
100.0000 mg | ORAL_TABLET | Freq: Two times a day (BID) | ORAL | Status: DC
  Administered 2023-08-05 – 2023-08-08 (×7): 100 mg via ORAL
  Filled 2023-08-05 (×7): qty 1

## 2023-08-05 MED ORDER — ONDANSETRON HCL 4 MG/2ML IJ SOLN
4.0000 mg | Freq: Four times a day (QID) | INTRAMUSCULAR | Status: DC | PRN
  Administered 2023-08-05: 4 mg via INTRAVENOUS
  Filled 2023-08-05: qty 2

## 2023-08-05 MED ORDER — HYDRALAZINE HCL 20 MG/ML IJ SOLN
5.0000 mg | Freq: Four times a day (QID) | INTRAMUSCULAR | Status: DC | PRN
  Administered 2023-08-05: 5 mg via INTRAVENOUS
  Filled 2023-08-05: qty 1

## 2023-08-05 MED ORDER — LACTATED RINGERS IV SOLN: INTRAVENOUS | Status: AC

## 2023-08-05 MED ORDER — IOHEXOL 300 MG/ML  SOLN
100.0000 mL | Freq: Once | INTRAMUSCULAR | Status: AC | PRN
  Administered 2023-08-05: 100 mL via INTRAVENOUS

## 2023-08-05 MED ORDER — ALBUTEROL SULFATE (2.5 MG/3ML) 0.083% IN NEBU
2.5000 mg | INHALATION_SOLUTION | RESPIRATORY_TRACT | Status: DC | PRN
Start: 2023-08-05 — End: 2023-08-08
  Administered 2023-08-05 – 2023-08-06 (×2): 2.5 mg via RESPIRATORY_TRACT
  Filled 2023-08-05 (×2): qty 3

## 2023-08-05 NOTE — ED Provider Notes (Signed)
 [place] EMERGENCY DEPARTMENT AT [facility] Provider Note   CSN: [id number] Arrival date & time: 08/05/23  4098     History  Chief Complaint  Patient presents with   Abdominal Pain    Joseph Bates is a 78 y.o. male.  Patient is a 78 year old male with history of hyperlipidemia, prior hernia repair surgery.  Patient presenting today with complaints of abdominal pain.  He describes a constant ache in the area of his umbilicus that has been constant since approximately 630 this evening.  Pain has been worsening.  He has felt nauseated, but has not vomited.  No fevers or chills.  No diarrhea or constipation.       Home Medications Prior to Admission medications   Medication Sig Start Date End Date Taking? Authorizing Provider  albuterol  (PROVENTIL  HFA;VENTOLIN  HFA) 108 (90 Base) MCG/ACT inhaler Every 4-6 hours prn. Patient taking differently: Every 4-6 hours prn. 11/26/15   Diamond Formica, MD  atorvastatin  (LIPITOR) 10 MG tablet Take 10 mg by mouth at bedtime.    [provider]  budesonide -formoterol  (SYMBICORT ) 160-4.5 MCG/ACT inhaler Take 2 puffs first thing in am and then another 2 puffs about 12 hours later. Patient taking differently: Inhale 2 puffs into the lungs 2 (two) times daily. 11/26/15   Wert, Michael B, MD  ibuprofen (ADVIL,MOTRIN) 200 MG tablet Take 200 mg by mouth every 6 (six) hours as needed for headache or moderate pain.    [provider]      Allergies    Naproxen and Naproxen sodium    Review of Systems   Review of Systems  All other systems reviewed and are negative.   Physical Exam Updated Vital Signs BP (!) 195/89   Pulse 74   Temp 98.6 F (37 C) (Oral)   Resp 16   SpO2 96%  Physical Exam Vitals and nursing note reviewed.  Constitutional:      General: He is not in acute distress.    Appearance: He is well-developed. He is not diaphoretic.  HENT:     Head: Normocephalic and atraumatic.  Cardiovascular:      Rate and Rhythm: Normal rate and regular rhythm.     Heart sounds: No murmur heard.    No friction rub.  Pulmonary:     Effort: Pulmonary effort is normal. No respiratory distress.     Breath sounds: Normal breath sounds. No wheezing or rales.  Abdominal:     General: Bowel sounds are normal. There is no distension.     Palpations: Abdomen is soft.     Tenderness: There is abdominal tenderness in the periumbilical area. There is no right CVA tenderness, left CVA tenderness, guarding or rebound.  Musculoskeletal:        General: Normal range of motion.     Cervical back: Normal range of motion and neck supple.  Skin:    General: Skin is warm and dry.  Neurological:     Mental Status: He is alert and oriented to person, place, and time.     Coordination: Coordination normal.     ED Results / Procedures / Treatments   Labs (all labs ordered are listed, but only abnormal results are displayed) Labs Reviewed  URINALYSIS, ROUTINE W REFLEX MICROSCOPIC - Abnormal; Notable for the following components:      Result Value   Protein, ur 30 (*)    All other components within normal limits  CBC  LIPASE, BLOOD  COMPREHENSIVE METABOLIC PANEL WITH GFR  EKG None  Radiology No results found.  Procedures Procedures  {Document cardiac monitor, telemetry assessment procedure when appropriate:1}  Medications Ordered in ED Medications  sodium chloride  0.9 % bolus 1,000 mL (has no administration in time range)    ED Course/ Medical Decision Making/ A&P   {   Click here for ABCD2, HEART and other calculatorsREFRESH Note before signing :1}                              Medical Decision Making Amount and/or Complexity of Data Reviewed Labs: ordered. Radiology: ordered.   ***  {Document critical care time when appropriate:1} {Document review of labs and clinical decision tools ie heart score, Chads2Vasc2 etc:1}  {Document your independent review of radiology images, and any  outside records:1} {Document your discussion with family members, caretakers, and with consultants:1} {Document social determinants of health affecting pt's care:1} {Document your decision making why or why not admission, treatments were needed:1} Final Clinical Impression(s) / ED Diagnoses Final diagnoses:  None    Rx / DC Orders ED Discharge Orders     None

## 2023-08-05 NOTE — ED Notes (Signed)
Joseph Bates with cl called for transport

## 2023-08-05 NOTE — H&P (Addendum)
 History and Physical  Joseph Bates:[id number] DOB: Mar 21, 1945 DOA: 08/05/2023  PCP: Jimmey Mould, MD   Chief Complaint: Abdominal pain, nausea  HPI: Joseph Bates is a pleasant 78 y.o. male with medical history significant for hyperlipidemia admitted to the hospital with abdominal pain and nausea found to have acute pancreatitis.  States that after dinner last night, he felt like there was a break in his left central abdomen.  There was associated nausea.  Burning discomfort that felt like reflux.  He took some Rolaids without relief.  Pain and nausea plagued him through the whole night, finally at about 4 AM he decided to come to the ER for evaluation.  Interestingly, over the last year or so he has had similar abdominal pain after evening meal for 5 times but the pain was less severe, and it seemed to always resolve on its own by the morning.  He was recently diagnosed with a right lower extremity cellulitis, and has been on p.o. antibiotics, with infection improving this week.  Bowel movements have been unremarkable, no unintentional weight loss.  Patient denies significant alcohol consumption.  Review of Systems: Please see HPI for pertinent positives and negatives. A complete 10 system review of systems are otherwise negative.  Past Medical History:  Diagnosis Date   Arthritis    Asthma    COPD (chronic obstructive pulmonary disease) ([facility])    Dyspnea    only if he does not do his symbicort    GERD (gastroesophageal reflux disease)    occasionaly   History of kidney stones    Hyperlipidemia    Past Surgical History:  Procedure Laterality Date   HERNIA REPAIR     INSERTION OF MESH N/A 09/29/2017   Procedure: INSERTION OF MESH;  Surgeon: Shela Derby, MD;  Location: [facility] OR;  Service: General;  Laterality: N/A;   JOINT REPLACEMENT Bilateral    knee   UMBILICAL HERNIA REPAIR N/A 09/29/2017   Procedure: LAPAROSCOPIC UMBILICAL HERNIA WITH MESH;  Surgeon: Shela Derby,  MD;  Location: [facility] OR;  Service: General;  Laterality: N/A;   vocal cord surgery     Social History:  reports that he quit smoking about 17 years ago. His smoking use included cigarettes. He started smoking about 57 years ago. He has a 80 pack-year smoking history. He has never used smokeless tobacco. He reports current alcohol use. He reports that he does not use drugs.  Allergies  Allergen Reactions   Naproxen Other (See Comments)   Naproxen Sodium Itching    Family History  Problem Relation Age of Onset   Ovarian cancer Maternal Grandmother      Prior to Admission medications   Medication Sig Start Date End Date Taking? Authorizing Provider  cephALEXin (KEFLEX) 500 MG capsule Take 500 mg by mouth every 6 (six) hours. 07/30/23  Yes [provider]  doxycycline (VIBRA-TABS) 100 MG tablet Take 100 mg by mouth 2 (two) times daily. 08/01/23  Yes [provider]  albuterol  (PROVENTIL  HFA;VENTOLIN  HFA) 108 (90 Base) MCG/ACT inhaler Every 4-6 hours prn. Patient taking differently: Every 4-6 hours prn. 11/26/15   Diamond Formica, MD  atorvastatin (LIPITOR) 10 MG tablet Take 10 mg by mouth at bedtime.    [provider]  budesonide -formoterol  (SYMBICORT ) 160-4.5 MCG/ACT inhaler Take 2 puffs first thing in am and then another 2 puffs about 12 hours later. Patient taking differently: Inhale 2 puffs into the lungs 2 (two) times daily. 11/26/15   Diamond Formica,  MD  ibuprofen (ADVIL,MOTRIN) 200 MG tablet Take 200 mg by mouth every 6 (six) hours as needed for headache or moderate pain.    [provider]    Physical Exam: BP (!) 190/94 (BP Location: Right Arm)   Pulse 70   Temp 98 F (36.7 C) (Oral)   Resp 12   SpO2 93%  General:  Alert, oriented, calm, in no acute distress  Cardiovascular: RRR, no murmurs or rubs, mild nonpitting edema in the right lower extremity Respiratory: clear to auscultation bilaterally, no wheezes, no crackles  Abdomen: soft,  nontender, distended/obese, normal bowel tones heard  Skin: dry, chronic venous stasis changes in the bilateral lower extremities, right lower extremity with some mild erythema, no warmth, no areas of fluctuance Musculoskeletal: no joint effusions, normal range of motion  Psychiatric: appropriate affect, normal speech  Neurologic: extraocular muscles intact, clear speech, moving all extremities with intact sensorium         Labs on Admission:  Basic Metabolic Panel: Recent Labs  Lab 08/05/23 0444  NA 141  K 4.3  CL 106  CO2 22  GLUCOSE 162*  BUN 23  CREATININE 1.33*  CALCIUM 10.5*   Liver Function Tests: Recent Labs  Lab 08/05/23 0444  AST 30  ALT 33  ALKPHOS 82  BILITOT 1.0  PROT 7.5  ALBUMIN 3.7   Recent Labs  Lab 08/05/23 0444  LIPASE >3,000*   No results for input(s): "AMMONIA" in the last 168 hours. CBC: Recent Labs  Lab 08/05/23 0444  WBC 10.4  HGB 16.0  HCT 50.1  MCV 88.2  PLT 191   Cardiac Enzymes: No results for input(s): "CKTOTAL", "CKMB", "CKMBINDEX", "TROPONINI" in the last 168 hours. BNP (last 3 results) No results for input(s): "BNP" in the last 8760 hours.  ProBNP (last 3 results) No results for input(s): "PROBNP" in the last 8760 hours.  CBG: No results for input(s): "GLUCAP" in the last 168 hours.  Radiological Exams on Admission: CT ABDOMEN PELVIS W CONTRAST Result Date: 08/05/2023 CLINICAL DATA:  Abdominal pain. EXAM: CT ABDOMEN AND PELVIS WITH CONTRAST TECHNIQUE: Multidetector CT imaging of the abdomen and pelvis was performed using the standard protocol following bolus administration of intravenous contrast. RADIATION DOSE REDUCTION: This exam was performed according to the departmental dose-optimization program which includes automated exposure control, adjustment of the mA and/or kV according to patient size and/or use of iterative reconstruction technique. CONTRAST:  100mL OMNIPAQUE IOHEXOL 300 MG/ML  SOLN COMPARISON:  None  Available. FINDINGS: Lower chest: 5 mm left lower lobe pulmonary nodule identified on 07/03. Hepatobiliary: A tiny hypodensity in the liver parenchyma is too small to characterize but is statistically most likely benign. No followup imaging is recommended. There is no evidence for gallstones, gallbladder wall thickening, or pericholecystic fluid. No intrahepatic or extrahepatic biliary dilation. Pancreas: Numerous low-density/cystic lesions are seen scattered through the pancreatic parenchyma including 2.2 cm lesion in the posterior pancreatic head, 1.8 cm lesion in the body of the pancreas and 1.9 cm lesion in the tail the pancreas. There is subtle peripancreatic edema in the region of the pancreatic head and uncinate process (see image 43/2). Mild diffuse prominence of the main pancreatic duct. Spleen: Tiny hypodensity in the spleen cannot be definitively characterized but is likely benign. Adrenals/Urinary Tract: No adrenal nodule or mass. Tiny well-defined homogeneous low-density lesions in both kidneys are too small to characterize but are statistically most likely benign and probably cysts. No followup imaging is recommended. No evidence for hydroureter. The  urinary bladder appears normal for the degree of distention. Stomach/Bowel: Small hiatal hernia. Stomach otherwise unremarkable. Duodenum is normally positioned as is the ligament of Treitz. Wall of the distal descending and proximal transverse duodenum is ill-defined in the region of the above described edema. No small bowel wall thickening. No small bowel dilatation. The terminal ileum is normal. The appendix is normal. No gross colonic mass. No colonic wall thickening. Diverticular changes are noted in the left colon without evidence of diverticulitis. Vascular/Lymphatic: There is moderate atherosclerotic calcification of the abdominal aorta without aneurysm. There is no gastrohepatic or hepatoduodenal ligament lymphadenopathy. No retroperitoneal or  mesenteric lymphadenopathy. No pelvic sidewall lymphadenopathy. Reproductive: The prostate gland and seminal vesicles are unremarkable. Other: No intraperitoneal free fluid. Musculoskeletal: No worrisome lytic or sclerotic osseous abnormality. IMPRESSION: 1. Subtle peripancreatic edema in the region of the pancreatic head and uncinate process. Imaging features are compatible with acute pancreatitis. 2. Numerous low-density/cystic lesions scattered through the pancreatic parenchyma including 2.2 cm lesion in the posterior pancreatic head, 1.8 cm lesion in the body of the pancreas and 1.9 cm lesion in the tail the pancreas. Imaging features may reflect pseudocysts or side branch IPMNs. Follow-up MRI abdomen with and without contrast recommended. 3. Wall of the distal descending and proximal transverse duodenum is ill-defined in the region of the above described peripancreatic edema. This may be reactive to the above described pancreatitis. Duodenitis could also have this appearance. 4. Small hiatal hernia. 5. Left colonic diverticulosis without diverticulitis. 6. 5 mm left lower lobe pulmonary nodule. No follow-up needed if patient is low-risk.This recommendation follows the consensus statement: Guidelines for Management of Incidental Pulmonary Nodules Detected on CT Images: From the Fleischner Society 2017; Radiology 2017; 284:228-243. 7.  Aortic Atherosclerosis (ICD10-I70.0). Electronically Signed   By: Donnal Fusi M.D.   On: 08/05/2023 06:32   Assessment/Plan Joseph Bates is a pleasant 78 y.o. male with medical history significant for hyperlipidemia admitted to the hospital with abdominal pain and nausea found to have acute pancreatitis.   Acute pancreatitis-with abdominal pain, significantly elevated lipase, and CT changes as above.  Etiology is unclear at this time, though he has numerous cystic lesions throughout the pancreatic parenchyma. -Inpatient admission -N.p.o. -IV fluids -Pain and nausea  medications -MRI abdomen with and without contrast, to further characterize pancreatic cysts  Right lower extremity cellulitis-with very mild erythema superimposed on his chronic venous stasis changes, no tenderness, no significant warmth.  There is some blanchable erythema.  While doxycycline has been correlated with acute pancreatitis, given cystic appearance of his pancreas I feel his pancreatitis likely has a different etiology. -Continue p.o. doxycycline and Keflex  Hyperlipidemia-Lipitor  DVT prophylaxis: Lovenox   Full code  Consults called: None  Admission status: The appropriate patient status for this patient is INPATIENT. Inpatient status is judged to be reasonable and necessary in order to provide the required intensity of service to ensure the patient's safety. The patient's presenting symptoms, physical exam findings, and initial radiographic and laboratory data in the context of their chronic comorbidities is felt to place them at high risk for further clinical deterioration. Furthermore, it is not anticipated that the patient will be medically stable for discharge from the hospital within 2 midnights of admission.    I certify that at the point of admission it is my clinical judgment that the patient will require inpatient hospital care spanning beyond 2 midnights from the point of admission due to high intensity of service, high risk for further deterioration  and high frequency of surveillance required  Time spent: 56 minutes  Baylin Cabal Rickey Charm MD [facility] Pager [phone]  If 7PM-7AM, please contact night-coverage [email or web address] Password [facility]  08/05/2023, 10:46 AM

## 2023-08-05 NOTE — Plan of Care (Signed)
   Problem: Clinical Measurements: Goal: Ability to maintain clinical measurements within normal limits will improve Outcome: Progressing Goal: Will remain free from infection Outcome: Progressing Goal: Diagnostic test results will improve Outcome: Progressing Goal: Respiratory complications will improve Outcome: Progressing

## 2023-08-05 NOTE — Progress Notes (Addendum)
 Patients blood pressure is 177/93. Lorre Rosin, NP notified. See orders. Care plan continued.

## 2023-08-05 NOTE — Progress Notes (Signed)
 Patients BP is 177/91. Lorre Rosin, NP notified. Care plan continued.

## 2023-08-05 NOTE — ED Triage Notes (Signed)
 Pt is here for central (umbilical) abdominal pain which began around 6pm yesterday.  Pt states that the pain is sharp and associated with nausea.  Pt has had this pain before and it resolved on its own but this time it did not pass.

## 2023-08-05 NOTE — ED Notes (Signed)
 Patient transported to CT

## 2023-08-05 NOTE — ED Notes (Signed)
Pt is ambulating to the bathroom to void 

## 2023-08-05 NOTE — ED Notes (Signed)
 Patient picked up by [facility]. This RN notified Curt Dover, Secondary school teacher on 5W that patient is now en route to [facility].

## 2023-08-06 DIAGNOSIS — K859 Acute pancreatitis without necrosis or infection, unspecified: Secondary | ICD-10-CM | POA: Diagnosis not present

## 2023-08-06 LAB — CBC
HCT: 49.3 % (ref 39.0–52.0)
Hemoglobin: 14.9 g/dL (ref 13.0–17.0)
MCH: 28.3 pg (ref 26.0–34.0)
MCHC: 30.2 g/dL (ref 30.0–36.0)
MCV: 93.5 fL (ref 80.0–100.0)
Platelets: 151 10*3/uL (ref 150–400)
RBC: 5.27 MIL/uL (ref 4.22–5.81)
RDW: 13.4 % (ref 11.5–15.5)
WBC: 8.5 10*3/uL (ref 4.0–10.5)
nRBC: 0 % (ref 0.0–0.2)

## 2023-08-06 LAB — COMPREHENSIVE METABOLIC PANEL WITH GFR
ALT: 22 U/L (ref 0–44)
AST: 19 U/L (ref 15–41)
Albumin: 2.8 g/dL — ABNORMAL LOW (ref 3.5–5.0)
Alkaline Phosphatase: 56 U/L (ref 38–126)
Anion gap: 7 (ref 5–15)
BUN: 16 mg/dL (ref 8–23)
CO2: 21 mmol/L — ABNORMAL LOW (ref 22–32)
Calcium: 8.4 mg/dL — ABNORMAL LOW (ref 8.9–10.3)
Chloride: 106 mmol/L (ref 98–111)
Creatinine, Ser: 1.11 mg/dL (ref 0.61–1.24)
GFR, Estimated: >60 mL/min (ref >60)
Glucose, Bld: 101 mg/dL — ABNORMAL HIGH (ref 70–99)
Potassium: 4.1 mmol/L (ref 3.5–5.1)
Sodium: 134 mmol/L — ABNORMAL LOW (ref 135–145)
Total Bilirubin: 1.7 mg/dL — ABNORMAL HIGH (ref 0.0–1.2)
Total Protein: 6.3 g/dL — ABNORMAL LOW (ref 6.5–8.1)

## 2023-08-06 MED ORDER — FLUTICASONE FUROATE-VILANTEROL 200-25 MCG/ACT IN AEPB
1.0000 | INHALATION_SPRAY | Freq: Every day | RESPIRATORY_TRACT | Status: DC
  Administered 2023-08-06 – 2023-08-08 (×3): 1 via RESPIRATORY_TRACT
  Filled 2023-08-06: qty 28

## 2023-08-06 NOTE — Consult Note (Signed)
 [facility] Consultation Note  Referring Provider: [facility] Primary Care Physician:  Jimmey Mould, MD Primary Gastroenterologist:  Dr. Lavaughn Portland  Reason for Consultation:  pancreatitis  HPI: Joseph Bates is a 78 y.o. male admitted epigastric and periumbilical abdominal pain.  Imaging and labs consistent with pancreatitis.  Pain improving, but not resolved since admission.  No alcohol.  No gallstones on CT or MRI.  MRI and CT findings as below.  No prior pancreatitis and no family history of pancreatitis or pancreatic cancer.   Past Medical History:  Diagnosis Date   Arthritis    Asthma    COPD (chronic obstructive pulmonary disease) (HCC)    Dyspnea    only if he does not do his symbicort    GERD (gastroesophageal reflux disease)    occasionaly   History of kidney stones    Hyperlipidemia     Past Surgical History:  Procedure Laterality Date   HERNIA REPAIR     INSERTION OF MESH N/A 09/29/2017   Procedure: INSERTION OF MESH;  Surgeon: Shela Derby, MD;  Location: [facility] OR;  Service: General;  Laterality: N/A;   JOINT REPLACEMENT Bilateral    knee   UMBILICAL HERNIA REPAIR N/A 09/29/2017   Procedure: LAPAROSCOPIC UMBILICAL HERNIA WITH MESH;  Surgeon: Shela Derby, MD;  Location: [facility] OR;  Service: General;  Laterality: N/A;   vocal cord surgery      Prior to Admission medications   Medication Sig Start Date End Date Taking? Authorizing Provider  albuterol  (PROVENTIL  HFA;VENTOLIN  HFA) 108 (90 Base) MCG/ACT inhaler Every 4-6 hours prn. Patient taking differently: Inhale 2 puffs into the lungs as needed for wheezing or shortness of breath. 11/26/15  Yes Diamond Formica, MD  atorvastatin (LIPITOR) 10 MG tablet Take 10 mg by mouth at bedtime.   Yes [provider]  budesonide -formoterol  (SYMBICORT ) 160-4.5 MCG/ACT inhaler Take 2 puffs first thing in am and then another 2 puffs about 12 hours later. Patient taking differently: Inhale 2 puffs into the  lungs 2 (two) times daily. 11/26/15  Yes Diamond Formica, MD  cephALEXin (KEFLEX) 500 MG capsule Take 500 mg by mouth 4 (four) times daily. 07/30/23  Yes [provider]  doxycycline (VIBRA-TABS) 100 MG tablet Take 100 mg by mouth 2 (two) times daily. 08/01/23  Yes [provider]  ibuprofen (ADVIL,MOTRIN) 200 MG tablet Take 200 mg by mouth daily as needed for headache, moderate pain (pain score 4-6) or mild pain (pain score 1-3).   Yes [provider]    Current Facility-Administered Medications  Medication Dose Route Frequency Provider Last Rate Last Admin   acetaminophen  (TYLENOL ) tablet 650 mg  650 mg Oral Q6H PRN Jannette Mend, Mir M, MD       Or   acetaminophen  (TYLENOL ) suppository 650 mg  650 mg Rectal Q6H PRN Jannette Mend, Mir M, MD       albuterol  (PROVENTIL ) (2.5 MG/3ML) 0.083% nebulizer solution 2.5 mg  2.5 mg Nebulization Q2H PRN Jannette Mend, Mir M, MD   2.5 mg at 08/06/23 1005   atorvastatin (LIPITOR) tablet 10 mg  10 mg Oral Daily Ikramullah, Mir M, MD   10 mg at 08/06/23 0921   cephALEXin (KEFLEX) capsule 500 mg  500 mg Oral Q6H Jannette Mend, Mir M, MD   500 mg at 08/06/23 0503   doxycycline (VIBRA-TABS) tablet 100 mg  100 mg Oral Q12H Ikramullah, Mir M, MD   100 mg at 08/06/23 0921   enoxaparin (LOVENOX) injection 40 mg  40 mg  Subcutaneous Q24H Jannette Mend, Mir M, MD   40 mg at 08/05/23 1709   fluticasone furoate-vilanterol (BREO ELLIPTA) 200-25 MCG/ACT 1 puff  1 puff Inhalation Daily Oral Billings, MD   1 puff at 08/06/23 1040   hydrALAZINE (APRESOLINE) injection 5 mg  5 mg Intravenous Q6H PRN Chavez, Abigail, NP   5 mg at 08/05/23 1957   HYDROmorphone (DILAUDID) injection 0.5-1 mg  0.5-1 mg Intravenous Q2H PRN Jannette Mend, Mir M, MD   1 mg at 08/05/23 1825   LORazepam  (ATIVAN ) tablet 1 mg  1 mg Oral Once Jannette Mend, Mir M, MD       ondansetron  (ZOFRAN ) tablet 4 mg  4 mg Oral Q6H PRN Jannette Mend, Mir M, MD       Or   ondansetron  (ZOFRAN ) injection 4 mg  4 mg  Intravenous Q6H PRN Jannette Mend, Mir M, MD   4 mg at 08/05/23 1824   oxyCODONE (Oxy IR/ROXICODONE) immediate release tablet 5 mg  5 mg Oral Q4H PRN Gaylin Ke, MD        Allergies as of 08/05/2023 - Review Complete 08/05/2023  Allergen Reaction Noted   Naproxen sodium Itching     Family History  Problem Relation Age of Onset   Ovarian cancer Maternal Grandmother     Social History   Socioeconomic History   Marital status: Married    Spouse name: Not on file   Number of children: Not on file   Years of education: Not on file   Highest education level: Not on file  Occupational History   Occupation: PREP ROOM WORKER AT [employer]  Tobacco Use   Smoking status: Former    Current packs/day: 0.00    Average packs/day: 2.0 packs/day for 40.0 years (80.0 ttl pk-yrs)    Types: Cigarettes    Start date: 12/14/1965    Quit date: 12/14/2005    Years since quitting: 17.6   Smokeless tobacco: Never  Vaping Use   Vaping status: Never Used  Substance and Sexual Activity   Alcohol use: Yes    Comment: rarely   Drug use: Never   Sexual activity: Not on file  Other Topics Concern   Not on file  Social History Narrative   Not on file   Social Drivers of Health   Financial Resource Strain: Not on file  Food Insecurity: No Food Insecurity (08/05/2023)   Hunger Vital Sign    Worried About Running Out of Food in the Last Year: Never true    Ran Out of Food in the Last Year: Never true  Transportation Needs: No Transportation Needs (08/05/2023)   PRAPARE - Administrator, Civil Service (Medical): No    Lack of Transportation (Non-Medical): No  Physical Activity: Not on file  Stress: Not on file  Social Connections: Socially Integrated (08/05/2023)   Social Connection and Isolation Panel [NHANES]    Frequency of Communication with Friends and Family: Three times a week    Frequency of Social Gatherings with Friends and Family: Three times a week    Attends  Religious Services: More than 4 times per year    Active Member of Clubs or Organizations: Yes    Attends Banker Meetings: More than 4 times per year    Marital Status: Married  Catering manager Violence: Not At Risk (08/05/2023)   Humiliation, Afraid, Rape, and Kick questionnaire    Fear of Current or Ex-Partner: No    Emotionally Abused: No  Physically Abused: No    Sexually Abused: No    Review of Systems: As per HPI, all others negative  Physical Exam: Vital signs in last 24 hours: Temp:  [97.7 F (36.5 C)-98.3 F (36.8 C)] 98.3 F (36.8 C) (05/29 0436) Pulse Rate:  [69-81] 78 (05/29 0436) Resp:  [16-20] 18 (05/29 0436) BP: (138-187)/(65-100) 155/74 (05/29 0436) SpO2:  [95 %-98 %] 95 % (05/29 1040) Last BM Date : 08/05/23 General:   Alert,  overweight, pleasant and cooperative in NAD Head:  Normocephalic and atraumatic. Eyes:  Sclera clear, no icterus.   Conjunctiva pink. Ears:  Normal auditory acuity. Nose:  No deformity, discharge,  or lesions. Mouth:  No deformity or lesions.  Oropharynx pink but dry Neck:  Supple; no masses or thyromegaly. Lungs:  No visible respiratory distress Abdomen:  Soft, protuberant, mild epigastric tenderness without peritonitis, No masses, hepatosplenomegaly or hernias noted. No peritoneal signs Msk:  Symmetrical without gross deformities. Normal posture. Pulses:  Normal pulses noted. Extremities:  Without clubbing or edema. Neurologic:  Alert and  oriented x4;  grossly normal neurologically. Skin:  Intact without significant lesions or rashes. Psych:  Alert and cooperative. Normal mood and affect.   Lab Results: Recent Labs    08/05/23 0444 08/06/23 0425  WBC 10.4 8.5  HGB 16.0 14.9  HCT 50.1 49.3  PLT 191 151   BMET Recent Labs    08/05/23 0444 08/06/23 0425  NA 141 134*  K 4.3 4.1  CL 106 106  CO2 22 21*  GLUCOSE 162* 101*  BUN 23 16  CREATININE 1.33* 1.11  CALCIUM 10.5* 8.4*   LFT Recent Labs     08/06/23 0425  PROT 6.3*  ALBUMIN 2.8*  AST 19  ALT 22  ALKPHOS 56  BILITOT 1.7*   PT/INR No results for input(s): "LABPROT", "INR" in the last 72 hours.  Studies/Results: MR ABDOMEN W WO CONTRAST Result Date: 08/05/2023 CLINICAL DATA:  Pancreatitis identified on CT. Cystic lesions in the body the pancreas. EXAM: MRI ABDOMEN WITHOUT AND WITH CONTRAST TECHNIQUE: Multiplanar multisequence MR imaging of the abdomen was performed both before and after the administration of intravenous contrast. CONTRAST:  10mL GADAVIST GADOBUTROL 1 MMOL/ML IV SOLN COMPARISON:  CT 07/28/2023 FINDINGS: Some motion degradation of imaging related to breathing. Lower chest:  Lung bases are clear. Hepatobiliary: Normal hepatic parenchyma. Small benign cyst in LEFT hepatic lobe. No intrahepatic scratch the no intrahepatic biliary duct dilatation. Gallbladder normal. The common bile duct is normal caliber without duct dilatation, choledocholithiasis, or obstruction. Pancreas: No dilatation of the pancreatic duct through the body and tail the pancreas with side branch ductal ectasia. There is a cystic lesion in the uncinate the pancreas measuring approximately 3.3 cm (image 27/4. Extensive ductal ectasia and mild duct dilatation noted on image 20/series 4 of fat-suppressed T2 weighted imaging. No discrete obstructing lesion identified within the pancreatic head. Ductal dilatation extends to the ampullary region. No clear evidence of pancreas divisum anatomy. There is edema associated head of the pancreas small amount of fluid along the junction of the pancreas and second portion duodenum. No organized fluid collections are present. Spleen: Normal spleen. Adrenals/urinary tract: Adrenal glands normal. Bilateral simple renal cysts without enhancement. Stomach/Bowel: Small hiatal hernia. Limited view of the bowel is unremarkable. Vascular/Lymphatic: Abdominal aortic normal caliber. No retroperitoneal periportal lymphadenopathy.  Musculoskeletal: No aggressive osseous lesion IMPRESSION: 1. Extensive duct dilatation and side branch ductal ectasia throughout the head, body and tail the pancreas with a larger cystic  component in the uncinate of the pancreas. Findings suggest chronic pancreatitis with subsequent duct dilatation versus intraductal mucinous tumor with duct dilatation. 2. No obstructing lesion in the head of the pancreas to suggest adenocarcinoma. 3. No choledocholithiasis.  No ductus divisum.  No cholelithiasis. 4. Consider EUS with FNA to evaluate for intraductal mucinous tumor. 5. No intra or extra hepatic biliary duct dilatation. 6. Hiatal hernia. Electronically Signed   By: Deboraha Fallow M.D.   On: 08/05/2023 15:49   MR 3D Recon At Scanner Result Date: 08/05/2023 CLINICAL DATA:  Pancreatitis identified on CT. Cystic lesions in the body the pancreas. EXAM: MRI ABDOMEN WITHOUT AND WITH CONTRAST TECHNIQUE: Multiplanar multisequence MR imaging of the abdomen was performed both before and after the administration of intravenous contrast. CONTRAST:  10mL GADAVIST GADOBUTROL 1 MMOL/ML IV SOLN COMPARISON:  CT 07/28/2023 FINDINGS: Some motion degradation of imaging related to breathing. Lower chest:  Lung bases are clear. Hepatobiliary: Normal hepatic parenchyma. Small benign cyst in LEFT hepatic lobe. No intrahepatic scratch the no intrahepatic biliary duct dilatation. Gallbladder normal. The common bile duct is normal caliber without duct dilatation, choledocholithiasis, or obstruction. Pancreas: No dilatation of the pancreatic duct through the body and tail the pancreas with side branch ductal ectasia. There is a cystic lesion in the uncinate the pancreas measuring approximately 3.3 cm (image 27/4. Extensive ductal ectasia and mild duct dilatation noted on image 20/series 4 of fat-suppressed T2 weighted imaging. No discrete obstructing lesion identified within the pancreatic head. Ductal dilatation extends to the ampullary  region. No clear evidence of pancreas divisum anatomy. There is edema associated head of the pancreas small amount of fluid along the junction of the pancreas and second portion duodenum. No organized fluid collections are present. Spleen: Normal spleen. Adrenals/urinary tract: Adrenal glands normal. Bilateral simple renal cysts without enhancement. Stomach/Bowel: Small hiatal hernia. Limited view of the bowel is unremarkable. Vascular/Lymphatic: Abdominal aortic normal caliber. No retroperitoneal periportal lymphadenopathy. Musculoskeletal: No aggressive osseous lesion IMPRESSION: 1. Extensive duct dilatation and side branch ductal ectasia throughout the head, body and tail the pancreas with a larger cystic component in the uncinate of the pancreas. Findings suggest chronic pancreatitis with subsequent duct dilatation versus intraductal mucinous tumor with duct dilatation. 2. No obstructing lesion in the head of the pancreas to suggest adenocarcinoma. 3. No choledocholithiasis.  No ductus divisum.  No cholelithiasis. 4. Consider EUS with FNA to evaluate for intraductal mucinous tumor. 5. No intra or extra hepatic biliary duct dilatation. 6. Hiatal hernia. Electronically Signed   By: Deboraha Fallow M.D.   On: 08/05/2023 15:49   CT ABDOMEN PELVIS W CONTRAST Result Date: 08/05/2023 CLINICAL DATA:  Abdominal pain. EXAM: CT ABDOMEN AND PELVIS WITH CONTRAST TECHNIQUE: Multidetector CT imaging of the abdomen and pelvis was performed using the standard protocol following bolus administration of intravenous contrast. RADIATION DOSE REDUCTION: This exam was performed according to the departmental dose-optimization program which includes automated exposure control, adjustment of the mA and/or kV according to patient size and/or use of iterative reconstruction technique. CONTRAST:  100mL OMNIPAQUE IOHEXOL 300 MG/ML  SOLN COMPARISON:  None Available. FINDINGS: Lower chest: 5 mm left lower lobe pulmonary nodule identified  on 07/03. Hepatobiliary: A tiny hypodensity in the liver parenchyma is too small to characterize but is statistically most likely benign. No followup imaging is recommended. There is no evidence for gallstones, gallbladder wall thickening, or pericholecystic fluid. No intrahepatic or extrahepatic biliary dilation. Pancreas: Numerous low-density/cystic lesions are seen scattered through the pancreatic parenchyma  including 2.2 cm lesion in the posterior pancreatic head, 1.8 cm lesion in the body of the pancreas and 1.9 cm lesion in the tail the pancreas. There is subtle peripancreatic edema in the region of the pancreatic head and uncinate process (see image 43/2). Mild diffuse prominence of the main pancreatic duct. Spleen: Tiny hypodensity in the spleen cannot be definitively characterized but is likely benign. Adrenals/Urinary Tract: No adrenal nodule or mass. Tiny well-defined homogeneous low-density lesions in both kidneys are too small to characterize but are statistically most likely benign and probably cysts. No followup imaging is recommended. No evidence for hydroureter. The urinary bladder appears normal for the degree of distention. Stomach/Bowel: Small hiatal hernia. Stomach otherwise unremarkable. Duodenum is normally positioned as is the ligament of Treitz. Wall of the distal descending and proximal transverse duodenum is ill-defined in the region of the above described edema. No small bowel wall thickening. No small bowel dilatation. The terminal ileum is normal. The appendix is normal. No gross colonic mass. No colonic wall thickening. Diverticular changes are noted in the left colon without evidence of diverticulitis. Vascular/Lymphatic: There is moderate atherosclerotic calcification of the abdominal aorta without aneurysm. There is no gastrohepatic or hepatoduodenal ligament lymphadenopathy. No retroperitoneal or mesenteric lymphadenopathy. No pelvic sidewall lymphadenopathy. Reproductive: The  prostate gland and seminal vesicles are unremarkable. Other: No intraperitoneal free fluid. Musculoskeletal: No worrisome lytic or sclerotic osseous abnormality. IMPRESSION: 1. Subtle peripancreatic edema in the region of the pancreatic head and uncinate process. Imaging features are compatible with acute pancreatitis. 2. Numerous low-density/cystic lesions scattered through the pancreatic parenchyma including 2.2 cm lesion in the posterior pancreatic head, 1.8 cm lesion in the body of the pancreas and 1.9 cm lesion in the tail the pancreas. Imaging features may reflect pseudocysts or side branch IPMNs. Follow-up MRI abdomen with and without contrast recommended. 3. Wall of the distal descending and proximal transverse duodenum is ill-defined in the region of the above described peripancreatic edema. This may be reactive to the above described pancreatitis. Duodenitis could also have this appearance. 4. Small hiatal hernia. 5. Left colonic diverticulosis without diverticulitis. 6. 5 mm left lower lobe pulmonary nodule. No follow-up needed if patient is low-risk.This recommendation follows the consensus statement: Guidelines for Management of Incidental Pulmonary Nodules Detected on CT Images: From the Fleischner Society 2017; Radiology 2017; 284:228-243. 7.  Aortic Atherosclerosis (ICD10-I70.0). Electronically Signed   By: Donnal Fusi M.D.   On: 08/05/2023 06:32    Impression:   Acute idiopathic pancreatitis.  No obvious gallstones.  No alcohol.  No obvious offending medications.  No hypertriglyceridemia. 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 Abdominal pain, suspect from #1 above.  Plan:   Medical management pancreatitis (IVF, judicious analgesia). RUQ U/S tomorrow. Clear liquid diet ok. Consider outpatient elective EUS. [facility] will follow.   LOS: 1 day   Teya Otterson M  08/06/2023, 11:40 AM  Cell [phone] If no answer or after 5 PM call [phone]

## 2023-08-06 NOTE — Hospital Course (Signed)
 78 y.o. male with medical history significant for hyperlipidemia admitted to the hospital with abdominal pain and nausea found to have acute pancreatitis.  States that after dinner last night, he felt like there was a break in his left central abdomen.  There was associated nausea.  Burning discomfort that felt like reflux.  He took some Rolaids without relief.  Pain and nausea plagued him through the whole night, finally at about 4 AM he decided to come to the ER for evaluation.  Interestingly, over the last year or so he has had similar abdominal pain after evening meal for 5 times but the pain was less severe, and it seemed to always resolve on its own by the morning.  He was recently diagnosed with a right lower extremity cellulitis, and has been on p.o. antibiotics, with infection improving this week.  Bowel movements have been unremarkable, no unintentional weight loss.  Patient denies significant alcohol consumption.

## 2023-08-06 NOTE — Progress Notes (Signed)
  Progress Note   Patient: Joseph Bates ZOX:[id number] DOB: [birth year] DOA: 08/05/2023     1 DOS: the patient was seen and examined on 08/06/2023   Brief hospital course: 78 y.o. male with medical history significant for hyperlipidemia admitted to the hospital with abdominal pain and nausea found to have acute pancreatitis.  States that after dinner last night, he felt like there was a break in his left central abdomen.  There was associated nausea.  Burning discomfort that felt like reflux.  He took some Rolaids without relief.  Pain and nausea plagued him through the whole night, finally at about 4 AM he decided to come to the ER for evaluation.  Interestingly, over the last year or so he has had similar abdominal pain after evening meal for 5 times but the pain was less severe, and it seemed to always resolve on its own by the morning.  He was recently diagnosed with a right lower extremity cellulitis, and has been on p.o. antibiotics, with infection improving this week.  Bowel movements have been unremarkable, no unintentional weight loss.  Patient denies significant alcohol consumption.   Assessment and Plan: Acute pancreatitis-with abdominal pain, significantly elevated lipase, and CT changes as above.  Etiology is unclear at this time, though he has numerous cystic lesions throughout the pancreatic parenchyma. -MRI abdomen reviewed with GI. Findings c/w sequella of chronic pancreatitis, however study is unable to discern if pancreatic duct mass exists with rec for EUS at some point -Appreciate input by GI. Rec for RUQ US  in AM -Advance diet as tolerated   Right lower extremity cellulitis-with very mild erythema superimposed on his chronic venous stasis changes, no tenderness, no significant warmth.  -Continue p.o. doxycycline and Keflex   Hyperlipidemia-On Lipitor      Subjective: Reports less abd discomfort this AM. Asking about advancing diet  Physical Exam: Vitals:   08/05/23 2336  08/06/23 0436 08/06/23 1040 08/06/23 1207  BP: 138/65 (!) 155/74  (!) 156/89  Pulse: 81 78  70  Resp: 20 18  19   Temp: 98 F (36.7 C) 98.3 F (36.8 C)  98 F (36.7 C)  TempSrc: Oral Oral    SpO2: 98% 95% 95% 98%  Weight:      Height:       General exam: Awake, laying in bed, in nad Respiratory system: Normal respiratory effort, no wheezing Cardiovascular system: regular rate, s1, s2 Gastrointestinal system: Soft, nondistended, positive BS Central nervous system: CN2-12 grossly intact, strength intact Extremities: Perfused, no clubbing Skin: Normal skin turgor, no notable skin lesions seen Psychiatry: Mood normal // no visual hallucinations   Data Reviewed:  Labs reviewed: Na 134, K 4.1, Cr 1.11, WBC 8.5, Hgb 14.9, Plts 151  Family Communication: Pt in room, family at bedside  Disposition: Status is: Inpatient Remains inpatient appropriate because: severity of illness  Planned Discharge Destination: Home    Author: Cherylle Corwin, MD 08/06/2023 5:31 PM  For on call review [email or web address].

## 2023-08-06 NOTE — Plan of Care (Signed)

## 2023-08-06 NOTE — TOC Initial Note (Signed)
 Transition of Care [facility]) - Initial/Assessment Note    Patient Details  Name: Joseph Bates MRN: [id number] Date of Birth: [birth year]  Transition of Care [facility]) CM/SW Contact:    Kathryn Parish, RN Phone Number: 08/06/2023, 3:06 PM  Clinical Narrative:                 CM spoke with patient at bedside. Patient states PTA lives with spouse in a townhouse; PCP/insurance verified; DME-walker, [facility];; No HH, oxygen or SDOH needs. Patient will be transported home at discharge by spouse. No TOC needs identified during visit. TOC signing off  Expected Discharge Plan: Home/Self Care Barriers to Discharge: No Barriers Identified   Patient Goals and CMS Choice Patient states their goals for this hospitalization and ongoing recovery are:: Home with spouse   Choice offered to / list presented to : NA      Expected Discharge Plan and Services In-house Referral: NA Discharge Planning Services: CM Consult Post Acute Care Choice: NA Living arrangements for the past 2 months: Single Family Home                 DME Arranged: N/A DME Agency: NA       HH Arranged: NA HH Agency: NA        Prior Living Arrangements/Services Living arrangements for the past 2 months: Single Family Home Lives with:: Spouse Patient language and need for interpreter reviewed:: Yes Do you feel safe going back to the place where you live?: Yes      Need for Family Participation in Patient Care: No (Comment) Care giver support system in place?: Yes (comment) Current home services: DME (walker, [facility],) Criminal Activity/Legal Involvement Pertinent to Current Situation/Hospitalization: No - Comment as needed  Activities of Daily Living   ADL Screening (condition at time of admission) Independently performs ADLs?: Yes (appropriate for developmental age) Is the patient deaf or have difficulty hearing?: No Does the patient have difficulty seeing, even when wearing glasses/contacts?: No Does the patient have  difficulty concentrating, remembering, or making decisions?: No  Permission Sought/Granted Permission sought to share information with : Case Manager Permission granted to share information with : Yes, Verbal Permission Granted  Share Information with NAME: [place]      Permission granted to share info w Relationship: Spouse  Permission granted to share info w Contact Information: [phone]  Emotional Assessment Appearance:: Appears stated age Attitude/Demeanor/Rapport: Gracious Affect (typically observed): Appropriate Orientation: : Oriented to Self, Oriented to Place, Oriented to  Time Alcohol / Substance Use: Not Applicable Psych Involvement: No (comment)  Admission diagnosis:  Acute pancreatitis [K85.90] Acute pancreatitis, unspecified complication status, unspecified pancreatitis type [K85.90] Patient Active Problem List   Diagnosis Date Noted   Acute pancreatitis 08/05/2023   Obesity, morbid (HCC) 01/03/2015   Asthmatic bronchitis , chronic (HCC) 05/08/2008   HYPERLIPIDEMIA 03/01/2007   PCP:  Jimmey Mould, MD Pharmacy:   [facility] [phone] - [place],  - [address] [place] [address] Phone: [phone] Fax: [phone]     Social Drivers of Health (SDOH) Social History: SDOH Screenings   Food Insecurity: No Food Insecurity (08/05/2023)  Housing: Low Risk  (08/05/2023)  Transportation Needs: No Transportation Needs (08/05/2023)  Utilities: Not At Risk (08/05/2023)  Social Connections: Socially Integrated (08/05/2023)  Tobacco Use: Medium Risk (08/05/2023)   SDOH Interventions:     Readmission Risk Interventions    08/06/2023    3:04 PM  Readmission Risk Prevention Plan  Post Dischage Appt Complete  Medication Screening Complete  Transportation Screening Complete

## 2023-08-07 ENCOUNTER — Inpatient Hospital Stay (HOSPITAL_COMMUNITY)

## 2023-08-07 DIAGNOSIS — K859 Acute pancreatitis without necrosis or infection, unspecified: Secondary | ICD-10-CM | POA: Diagnosis not present

## 2023-08-07 LAB — COMPREHENSIVE METABOLIC PANEL WITH GFR
ALT: 19 U/L (ref 0–44)
AST: 19 U/L (ref 15–41)
Albumin: 2.6 g/dL — ABNORMAL LOW (ref 3.5–5.0)
Alkaline Phosphatase: 54 U/L (ref 38–126)
Anion gap: 6 (ref 5–15)
BUN: 17 mg/dL (ref 8–23)
CO2: 24 mmol/L (ref 22–32)
Calcium: 8.3 mg/dL — ABNORMAL LOW (ref 8.9–10.3)
Chloride: 107 mmol/L (ref 98–111)
Creatinine, Ser: 1.2 mg/dL (ref 0.61–1.24)
GFR, Estimated: >60 mL/min (ref >60)
Glucose, Bld: 97 mg/dL (ref 70–99)
Potassium: 3.8 mmol/L (ref 3.5–5.1)
Sodium: 137 mmol/L (ref 135–145)
Total Bilirubin: 2.2 mg/dL — ABNORMAL HIGH (ref 0.0–1.2)
Total Protein: 6.2 g/dL — ABNORMAL LOW (ref 6.5–8.1)

## 2023-08-07 LAB — CBC
HCT: 45.2 % (ref 39.0–52.0)
Hemoglobin: 14.2 g/dL (ref 13.0–17.0)
MCH: 28.3 pg (ref 26.0–34.0)
MCHC: 31.4 g/dL (ref 30.0–36.0)
MCV: 90 fL (ref 80.0–100.0)
Platelets: 165 10*3/uL (ref 150–400)
RBC: 5.02 MIL/uL (ref 4.22–5.81)
RDW: 13.4 % (ref 11.5–15.5)
WBC: 6.5 10*3/uL (ref 4.0–10.5)
nRBC: 0 % (ref 0.0–0.2)

## 2023-08-07 LAB — LIPASE, BLOOD: Lipase: 79 U/L — ABNORMAL HIGH (ref 11–51)

## 2023-08-07 NOTE — Progress Notes (Signed)
 Subjective: Abdominal pain improving. Is hungry.  Objective: Vital signs in last 24 hours: Temp:  [98 F (36.7 C)-98.5 F (36.9 C)] 98.2 F (36.8 C) (05/30 0400) Pulse Rate:  [70-71] 71 (05/30 0400) Resp:  [17-19] 18 (05/30 0400) BP: (141-156)/(86-89) 141/86 (05/30 0400) SpO2:  [95 %-98 %] 95 % (05/30 0811) Weight change:  Last BM Date : 08/05/23  PE: GEN:  Overweight, NAD ABD:  Soft, mild tenderness without peritonitis  Lab Results: CBC    Component Value Date/Time   WBC 6.5 08/07/2023 0357   RBC 5.02 08/07/2023 0357   HGB 14.2 08/07/2023 0357   HCT 45.2 08/07/2023 0357   PLT 165 08/07/2023 0357   MCV 90.0 08/07/2023 0357   MCH 28.3 08/07/2023 0357   MCHC 31.4 08/07/2023 0357   RDW 13.4 08/07/2023 0357   LYMPHSABS 1.2 07/24/2008 1610   MONOABS 0.4 07/24/2008 1610   EOSABS 0.2 07/24/2008 1610   BASOSABS 0.0 07/24/2008 1610  CMP     Component Value Date/Time   NA 137 08/07/2023 0357   K 3.8 08/07/2023 0357   CL 107 08/07/2023 0357   CO2 24 08/07/2023 0357   GLUCOSE 97 08/07/2023 0357   BUN 17 08/07/2023 0357   CREATININE 1.20 08/07/2023 0357   CALCIUM  8.3 (L) 08/07/2023 0357   PROT 6.2 (L) 08/07/2023 0357   ALBUMIN 2.6 (L) 08/07/2023 0357   AST 19 08/07/2023 0357   ALT 19 08/07/2023 0357   ALKPHOS 54 08/07/2023 0357   BILITOT 2.2 (H) 08/07/2023 0357   GFRNONAA >60 08/07/2023 0357   RUQ U/S:  No gallstones  Assessment:  Acute idiopathic pancreatitis.  No obvious gallstones.  No alcohol.  No obvious offending medications.  No hypertriglyceridemia. 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 Abdominal pain, suspect from #1 above.  Improving.  Plan:   Advance diet as tolerated. Judicious analgesics. If tolerates advance to soft low fat diet today, suspect he can go home tomorrow with follow-up with me to discuss timing of elective outpatient EUS. [facility] will follow.   Joseph Bates 08/07/2023, 10:57 AM   Cell [phone] If no  answer or after 5 PM call [phone]

## 2023-08-07 NOTE — Plan of Care (Signed)

## 2023-08-07 NOTE — Progress Notes (Signed)
  Progress Note   Patient: Joseph Bates WUJ:[id number] DOB: May 11, 1945 DOA: 08/05/2023     2 DOS: the patient was seen and examined on 08/07/2023   Brief hospital course: 78 y.o. male with medical history significant for hyperlipidemia admitted to the hospital with abdominal pain and nausea found to have acute pancreatitis.  States that after dinner last night, he felt like there was a break in his left central abdomen.  There was associated nausea.  Burning discomfort that felt like reflux.  He took some Rolaids without relief.  Pain and nausea plagued him through the whole night, finally at about 4 AM he decided to come to the ER for evaluation.  Interestingly, over the last year or so he has had similar abdominal pain after evening meal for 5 times but the pain was less severe, and it seemed to always resolve on its own by the morning.  He was recently diagnosed with a right lower extremity cellulitis, and has been on p.o. antibiotics, with infection improving this week.  Bowel movements have been unremarkable, no unintentional weight loss.  Patient denies significant alcohol consumption.   Assessment and Plan: Acute pancreatitis-with abdominal pain, significantly elevated lipase, and CT changes as above.  Etiology is unclear at this time, though he has numerous cystic lesions throughout the pancreatic parenchyma. -MRI abdomen reviewed with GI. Findings c/w sequella of chronic pancreatitis, however study is unable to discern if pancreatic duct mass exists with rec for EUS at some point -Appreciate input by GI. RUQ US  reviewed, no stones, unremarkable -Lipase down to 79 -Per GI, recs to advance diet as tolerated   Right lower extremity cellulitis-with very mild erythema superimposed on his chronic venous stasis changes, no tenderness, no significant warmth.  -Continue p.o. doxycycline  and Keflex    Hyperlipidemia-On Lipitor      Subjective: Tolerated breakfast this AM without nausea or abd  pain  Physical Exam: Vitals:   08/06/23 1945 08/07/23 0400 08/07/23 0811 08/07/23 1313  BP: (!) 154/89 (!) 141/86  (!) 143/81  Pulse: 71 71  75  Resp: 17 18  16   Temp: 98.5 F (36.9 C) 98.2 F (36.8 C)  97.9 F (36.6 C)  TempSrc: Oral Oral    SpO2: 96% 95% 95% 93%  Weight:      Height:       General exam: Conversant, in no acute distress Respiratory system: normal chest rise, clear, no audible wheezing Cardiovascular system: regular rhythm, s1-s2 Gastrointestinal system: Nondistended, nontender, pos BS Central nervous system: No seizures, no tremors Extremities: No cyanosis, no joint deformities Skin: No rashes, no pallor Psychiatry: Affect normal // no auditory hallucinations   Data Reviewed:  Labs reviewed: Na 137, K 3.8, Cr 1.20, WBC 6.5, Hgb 14.2, Plts 165  Family Communication: Pt in room, family at bedside  Disposition: Status is: Inpatient Remains inpatient appropriate because: severity of illness  Planned Discharge Destination: Home    Author: Cherylle Corwin, MD 08/07/2023 5:18 PM  For on call review [email or web address].

## 2023-08-08 DIAGNOSIS — K859 Acute pancreatitis without necrosis or infection, unspecified: Secondary | ICD-10-CM | POA: Diagnosis not present

## 2023-08-08 LAB — BASIC METABOLIC PANEL WITH GFR
Anion gap: 9 (ref 5–15)
BUN: 24 mg/dL — ABNORMAL HIGH (ref 8–23)
CO2: 25 mmol/L (ref 22–32)
Calcium: 8.7 mg/dL — ABNORMAL LOW (ref 8.9–10.3)
Chloride: 105 mmol/L (ref 98–111)
Creatinine, Ser: 1.42 mg/dL — ABNORMAL HIGH (ref 0.61–1.24)
GFR, Estimated: 51 mL/min — ABNORMAL LOW
Glucose, Bld: 107 mg/dL — ABNORMAL HIGH (ref 70–99)
Potassium: 3.8 mmol/L (ref 3.5–5.1)
Sodium: 139 mmol/L (ref 135–145)

## 2023-08-08 LAB — COMPREHENSIVE METABOLIC PANEL WITH GFR
ALT: 28 U/L (ref 0–44)
AST: 32 U/L (ref 15–41)
Albumin: 2.6 g/dL — ABNORMAL LOW (ref 3.5–5.0)
Alkaline Phosphatase: 54 U/L (ref 38–126)
Anion gap: 6 (ref 5–15)
BUN: 25 mg/dL — ABNORMAL HIGH (ref 8–23)
CO2: 25 mmol/L (ref 22–32)
Calcium: 8.4 mg/dL — ABNORMAL LOW (ref 8.9–10.3)
Chloride: 106 mmol/L (ref 98–111)
Creatinine, Ser: 1.47 mg/dL — ABNORMAL HIGH (ref 0.61–1.24)
GFR, Estimated: 49 mL/min — ABNORMAL LOW (ref >60)
Glucose, Bld: 100 mg/dL — ABNORMAL HIGH (ref 70–99)
Potassium: 4.1 mmol/L (ref 3.5–5.1)
Sodium: 137 mmol/L (ref 135–145)
Total Bilirubin: 1.4 mg/dL — ABNORMAL HIGH (ref 0.0–1.2)
Total Protein: 6.2 g/dL — ABNORMAL LOW (ref 6.5–8.1)

## 2023-08-08 LAB — CBC
HCT: 47.3 % (ref 39.0–52.0)
Hemoglobin: 14.8 g/dL (ref 13.0–17.0)
MCH: 28.4 pg (ref 26.0–34.0)
MCHC: 31.3 g/dL (ref 30.0–36.0)
MCV: 90.8 fL (ref 80.0–100.0)
Platelets: 188 10*3/uL (ref 150–400)
RBC: 5.21 MIL/uL (ref 4.22–5.81)
RDW: 13.2 % (ref 11.5–15.5)
WBC: 5.9 10*3/uL (ref 4.0–10.5)
nRBC: 0 % (ref 0.0–0.2)

## 2023-08-08 MED ORDER — LACTATED RINGERS IV BOLUS
1000.0000 mL | Freq: Once | INTRAVENOUS | Status: AC
  Administered 2023-08-08: 1000 mL via INTRAVENOUS

## 2023-08-08 NOTE — Progress Notes (Signed)
 [facility] Progress Note  Joseph Bates 78 y.o. 03/11/1945   Subjective: Tolerating soft foods without worsened abdominal pain. Denies N/V. Denies abdominal pain. Reports a formed stool this morning.  Objective: Vital signs: Vitals:   08/08/23 0929 08/08/23 0932  BP:    Pulse:    Resp:    Temp:    SpO2: 95% 95%  T 98.1, P 60, BP 134/88  Physical Exam: Gen: alert, no acute distress, obese, pleasant HEENT: anicteric sclera CV: RRR Chest: CTA B Abd: minimal epigastric tenderness without guarding, soft, nondistended, +BS Ext: no edema  Lab Results: Recent Labs    08/07/23 0357 08/08/23 0552  NA 137 137  K 3.8 4.1  CL 107 106  CO2 24 25  GLUCOSE 97 100*  BUN 17 25*  CREATININE 1.20 1.47*  CALCIUM  8.3* 8.4*   Recent Labs    08/07/23 0357 08/08/23 0552  AST 19 32  ALT 19 28  ALKPHOS 54 54  BILITOT 2.2* 1.4*  PROT 6.2* 6.2*  ALBUMIN 2.6* 2.6*   Recent Labs    08/07/23 0357 08/08/23 0552  WBC 6.5 5.9  HGB 14.2 14.8  HCT 45.2 47.3  MCV 90.0 90.8  PLT 165 188      Assessment/Plan: Acute idiopathic pancreatitis - clinically better. Ok to go home on low fat diet. Dr. Audry Bates nurse will call to arrange outpt EUS. Dr. Joan Bates aware.   Joseph Bates 08/08/2023, 9:50 AM  Questions please call [phone] ID: Joseph Bates, male   DOB: [birth year], 78 y.o.   MRN: [id number]

## 2023-08-08 NOTE — Plan of Care (Signed)

## 2023-08-08 NOTE — Discharge Summary (Signed)
 Physician Discharge Summary   Patient: Joseph Bates MRN: [id number] DOB: September 26, 1945  Admit date:     08/05/2023  Discharge date: 08/08/23  Discharge Physician: Joseph Bates   PCP: Joseph Mould, MD   Recommendations at discharge:    Follow up with PCP in 1-2 weeks Recommend recheck comprehensive metabolic panel in 1 week. Focus on LFT's and Cr F/u with [facility] as will be scheduled for EUS as outaptient  Discharge Diagnoses: Principal Problem:   Acute pancreatitis  Resolved Problems:   * No resolved hospital problems. *  Hospital Course: 78 y.o. male with medical history significant for hyperlipidemia admitted to the hospital with abdominal pain and nausea found to have acute pancreatitis.  States that after dinner last night, he felt like there was a break in his left central abdomen.  There was associated nausea.  Burning discomfort that felt like reflux.  He took some Rolaids without relief.  Pain and nausea plagued him through the whole night, finally at about 4 AM he decided to come to the ER for evaluation.  Interestingly, over the last year or so he has had similar abdominal pain after evening meal for 5 times but the pain was less severe, and it seemed to always resolve on its own by the morning.  He was recently diagnosed with a right lower extremity cellulitis, and has been on p.o. antibiotics, with infection improving this week.  Bowel movements have been unremarkable, no unintentional weight loss.  Patient denies significant alcohol consumption.   Assessment and Plan: Acute pancreatitis-with abdominal pain, significantly elevated lipase, and CT changes as above.  Etiology is unclear at this time, though he has numerous cystic lesions throughout the pancreatic parenchyma. -MRI abdomen reviewed with GI. Findings c/w sequella of chronic pancreatitis, however study is unable to discern if pancreatic duct mass exists with rec for EUS at some point -Appreciate input by GI. RUQ  US  reviewed, no stones, unremarkable -Lipase down to 79 -Diet was successfully advanced -[facility] to arrange f/u for EUD   Right lower extremity cellulitis-with very mild erythema superimposed on his chronic venous stasis changes, no tenderness, no significant warmth.  -Pt to complete p.o. doxycycline  and Keflex    Hyperlipidemia-On Lipitor       Consultants: GI Procedures performed:   Disposition: Home Diet recommendation:  Regular diet DISCHARGE MEDICATION: Allergies as of 08/08/2023       Reactions   Naproxen Sodium Itching        Medication List     STOP taking these medications    ibuprofen 200 MG tablet Commonly known as: ADVIL       TAKE these medications    albuterol  108 (90 Base) MCG/ACT inhaler Commonly known as: VENTOLIN  HFA Every 4-6 hours prn. What changed:  how much to take how to take this when to take this reasons to take this additional instructions   atorvastatin  10 MG tablet Commonly known as: LIPITOR Take 10 mg by mouth at bedtime.   budesonide -formoterol  160-4.5 MCG/ACT inhaler Commonly known as: SYMBICORT  Take 2 puffs first thing in am and then another 2 puffs about 12 hours later. What changed:  how much to take how to take this when to take this additional instructions   cephALEXin  500 MG capsule Commonly known as: KEFLEX  Take 500 mg by mouth 4 (four) times daily.   doxycycline  100 MG tablet Commonly known as: VIBRA -TABS Take 100 mg by mouth 2 (two) times daily.  Follow-up Information     Joseph Mould, MD Follow up in 2 week(s).   Specialty: Family Medicine Why: Hospital follow up Contact information: [address] [place] [address] [phone]                Discharge Exam: [name] Weights   08/05/23 1130  Weight: 114.8 kg   General exam: Awake, laying in bed, in nad Respiratory system: Normal respiratory effort, no wheezing Cardiovascular system: regular rate, s1,  s2 Gastrointestinal system: Soft, nondistended, positive BS Central nervous system: CN2-12 grossly intact, strength intact Extremities: Perfused, no clubbing Skin: Normal skin turgor, no notable skin lesions seen Psychiatry: Mood normal // no visual hallucinations   Condition at discharge: fair  The results of significant diagnostics from this hospitalization (including imaging, microbiology, ancillary and laboratory) are listed below for reference.   Imaging Studies: US  Abdomen Limited RUQ (LIVER/GB) Result Date: 08/07/2023 CLINICAL DATA:  [id number] Pancreatitis [id number] EXAM: ULTRASOUND ABDOMEN LIMITED RIGHT UPPER QUADRANT COMPARISON:  May 28, 25 FINDINGS: Evaluation is limited by shadowing bowel gas. Gallbladder: No gallstones or wall thickening visualized. No sonographic Murphy sign noted by sonographer. Common bile duct: Diameter: Visualized portion measures 5 mm, within normal limits. Liver: No focal lesion identified. Previously described hepatic cysts is not visualized sonographically. Within normal limits in parenchymal echogenicity. Portal vein is patent on color Doppler imaging with normal direction of blood flow towards the liver. Other: None. IMPRESSION: No cholelithiasis visualized. Please see separately dictated MRI report regarding pancreatic findings. Electronically Signed   By: Clancy Crimes M.D.   On: 08/07/2023 08:12   MR ABDOMEN W WO CONTRAST Result Date: 08/05/2023 CLINICAL DATA:  Pancreatitis identified on CT. Cystic lesions in the body the pancreas. EXAM: MRI ABDOMEN WITHOUT AND WITH CONTRAST TECHNIQUE: Multiplanar multisequence MR imaging of the abdomen was performed both before and after the administration of intravenous contrast. CONTRAST:  10mL GADAVIST  GADOBUTROL  1 MMOL/ML IV SOLN COMPARISON:  CT 07/28/2023 FINDINGS: Some motion degradation of imaging related to breathing. Lower chest:  Lung bases are clear. Hepatobiliary: Normal hepatic parenchyma. Small benign cyst in  LEFT hepatic lobe. No intrahepatic scratch the no intrahepatic biliary duct dilatation. Gallbladder normal. The common bile duct is normal caliber without duct dilatation, choledocholithiasis, or obstruction. Pancreas: No dilatation of the pancreatic duct through the body and tail the pancreas with side branch ductal ectasia. There is a cystic lesion in the uncinate the pancreas measuring approximately 3.3 cm (image 27/4. Extensive ductal ectasia and mild duct dilatation noted on image 20/series 4 of fat-suppressed T2 weighted imaging. No discrete obstructing lesion identified within the pancreatic head. Ductal dilatation extends to the ampullary region. No clear evidence of pancreas divisum anatomy. There is edema associated head of the pancreas small amount of fluid along the junction of the pancreas and second portion duodenum. No organized fluid collections are present. Spleen: Normal spleen. Adrenals/urinary tract: Adrenal glands normal. Bilateral simple renal cysts without enhancement. Stomach/Bowel: Small hiatal hernia. Limited view of the bowel is unremarkable. Vascular/Lymphatic: Abdominal aortic normal caliber. No retroperitoneal periportal lymphadenopathy. Musculoskeletal: No aggressive osseous lesion IMPRESSION: 1. Extensive duct dilatation and side branch ductal ectasia throughout the head, body and tail the pancreas with a larger cystic component in the uncinate of the pancreas. Findings suggest chronic pancreatitis with subsequent duct dilatation versus intraductal mucinous tumor with duct dilatation. 2. No obstructing lesion in the head of the pancreas to suggest adenocarcinoma. 3. No choledocholithiasis.  No ductus divisum.  No cholelithiasis. 4. Consider  EUS with FNA to evaluate for intraductal mucinous tumor. 5. No intra or extra hepatic biliary duct dilatation. 6. Hiatal hernia. Electronically Signed   By: Deboraha Fallow M.D.   On: 08/05/2023 15:49   MR 3D Recon At Scanner Result Date:  08/05/2023 CLINICAL DATA:  Pancreatitis identified on CT. Cystic lesions in the body the pancreas. EXAM: MRI ABDOMEN WITHOUT AND WITH CONTRAST TECHNIQUE: Multiplanar multisequence MR imaging of the abdomen was performed both before and after the administration of intravenous contrast. CONTRAST:  10mL GADAVIST  GADOBUTROL  1 MMOL/ML IV SOLN COMPARISON:  CT 07/28/2023 FINDINGS: Some motion degradation of imaging related to breathing. Lower chest:  Lung bases are clear. Hepatobiliary: Normal hepatic parenchyma. Small benign cyst in LEFT hepatic lobe. No intrahepatic scratch the no intrahepatic biliary duct dilatation. Gallbladder normal. The common bile duct is normal caliber without duct dilatation, choledocholithiasis, or obstruction. Pancreas: No dilatation of the pancreatic duct through the body and tail the pancreas with side branch ductal ectasia. There is a cystic lesion in the uncinate the pancreas measuring approximately 3.3 cm (image 27/4. Extensive ductal ectasia and mild duct dilatation noted on image 20/series 4 of fat-suppressed T2 weighted imaging. No discrete obstructing lesion identified within the pancreatic head. Ductal dilatation extends to the ampullary region. No clear evidence of pancreas divisum anatomy. There is edema associated head of the pancreas small amount of fluid along the junction of the pancreas and second portion duodenum. No organized fluid collections are present. Spleen: Normal spleen. Adrenals/urinary tract: Adrenal glands normal. Bilateral simple renal cysts without enhancement. Stomach/Bowel: Small hiatal hernia. Limited view of the bowel is unremarkable. Vascular/Lymphatic: Abdominal aortic normal caliber. No retroperitoneal periportal lymphadenopathy. Musculoskeletal: No aggressive osseous lesion IMPRESSION: 1. Extensive duct dilatation and side branch ductal ectasia throughout the head, body and tail the pancreas with a larger cystic component in the uncinate of the pancreas.  Findings suggest chronic pancreatitis with subsequent duct dilatation versus intraductal mucinous tumor with duct dilatation. 2. No obstructing lesion in the head of the pancreas to suggest adenocarcinoma. 3. No choledocholithiasis.  No ductus divisum.  No cholelithiasis. 4. Consider EUS with FNA to evaluate for intraductal mucinous tumor. 5. No intra or extra hepatic biliary duct dilatation. 6. Hiatal hernia. Electronically Signed   By: Deboraha Fallow M.D.   On: 08/05/2023 15:49   CT ABDOMEN PELVIS W CONTRAST Result Date: 08/05/2023 CLINICAL DATA:  Abdominal pain. EXAM: CT ABDOMEN AND PELVIS WITH CONTRAST TECHNIQUE: Multidetector CT imaging of the abdomen and pelvis was performed using the standard protocol following bolus administration of intravenous contrast. RADIATION DOSE REDUCTION: This exam was performed according to the departmental dose-optimization program which includes automated exposure control, adjustment of the mA and/or kV according to patient size and/or use of iterative reconstruction technique. CONTRAST:  OMNIPAQUE  IOHEXOL  300 MG/ML  SOLN COMPARISON:  None Available. FINDINGS: Lower chest: 5 mm left lower lobe pulmonary nodule identified on 07/03. Hepatobiliary: A tiny hypodensity in the liver parenchyma is too small to characterize but is statistically most likely benign. No followup imaging is recommended. There is no evidence for gallstones, gallbladder wall thickening, or pericholecystic fluid. No intrahepatic or extrahepatic biliary dilation. Pancreas: Numerous low-density/cystic lesions are seen scattered through the pancreatic parenchyma including 2.2 cm lesion in the posterior pancreatic head, 1.8 cm lesion in the body of the pancreas and 1.9 cm lesion in the tail the pancreas. There is subtle peripancreatic edema in the region of the pancreatic head and uncinate process (see image 43/2). Mild diffuse  prominence of the main pancreatic duct. Spleen: Tiny hypodensity in the  spleen cannot be definitively characterized but is likely benign. Adrenals/Urinary Tract: No adrenal nodule or mass. Tiny well-defined homogeneous low-density lesions in both kidneys are too small to characterize but are statistically most likely benign and probably cysts. No followup imaging is recommended. No evidence for hydroureter. The urinary bladder appears normal for the degree of distention. Stomach/Bowel: Small hiatal hernia. Stomach otherwise unremarkable. Duodenum is normally positioned as is the ligament of Treitz. Wall of the distal descending and proximal transverse duodenum is ill-defined in the region of the above described edema. No small bowel wall thickening. No small bowel dilatation. The terminal ileum is normal. The appendix is normal. No gross colonic mass. No colonic wall thickening. Diverticular changes are noted in the left colon without evidence of diverticulitis. Vascular/Lymphatic: There is moderate atherosclerotic calcification of the abdominal aorta without aneurysm. There is no gastrohepatic or hepatoduodenal ligament lymphadenopathy. No retroperitoneal or mesenteric lymphadenopathy. No pelvic sidewall lymphadenopathy. Reproductive: The prostate gland and seminal vesicles are unremarkable. Other: No intraperitoneal free fluid. Musculoskeletal: No worrisome lytic or sclerotic osseous abnormality. IMPRESSION: 1. Subtle peripancreatic edema in the region of the pancreatic head and uncinate process. Imaging features are compatible with acute pancreatitis. 2. Numerous low-density/cystic lesions scattered through the pancreatic parenchyma including 2.2 cm lesion in the posterior pancreatic head, 1.8 cm lesion in the body of the pancreas and 1.9 cm lesion in the tail the pancreas. Imaging features may reflect pseudocysts or side branch IPMNs. Follow-up MRI abdomen with and without contrast recommended. 3. Wall of the distal descending and proximal transverse duodenum is ill-defined in the  region of the above described peripancreatic edema. This may be reactive to the above described pancreatitis. Duodenitis could also have this appearance. 4. Small hiatal hernia. 5. Left colonic diverticulosis without diverticulitis. 6. 5 mm left lower lobe pulmonary nodule. No follow-up needed if patient is low-risk.This recommendation follows the consensus statement: Guidelines for Management of Incidental Pulmonary Nodules Detected on CT Images: From the Fleischner Society 2017; Radiology 2017; 284:228-243. 7.  Aortic Atherosclerosis (ICD10-I70.0). Electronically Signed   By: Donnal Fusi M.D.   On: 08/05/2023 06:32    Microbiology: Results for orders placed or performed in visit on 12/08/19  Novel Coronavirus, NAA ([facility])     Status: None   Collection Time: 12/08/19  9:09 AM   Specimen: Nasopharyngeal(NP) swabs in vial transport medium   Nasopharynge  Screenin  Result Value Ref Range Status   SARS-CoV-2, NAA Not Detected Not Detected Final    Comment: This nucleic acid amplification test was developed and its performance characteristics determined by [employer]. Nucleic acid amplification tests include RT-PCR and TMA. This test has not been FDA cleared or approved. This test has been authorized by FDA under an Emergency Use Authorization (EUA). This test is only authorized for the duration of time the declaration that circumstances exist justifying the authorization of the emergency use of in vitro diagnostic tests for detection of SARS-CoV-2 virus and/or diagnosis of COVID-19 infection under section 564(b)(1) of the Act, 21 U.S.C. 161WRU-0(A) (1), unless the authorization is terminated or revoked sooner. When diagnostic testing is negative, the possibility of a false negative result should be considered in the context of a patient's recent exposures and the presence of clinical signs and symptoms consistent with COVID-19. An individual without symptoms of COVID-19 and who  is not shedding SARS-CoV-2 virus wo uld expect to have a negative (not detected) result in this  assay.   SARS-COV-2, NAA 2 DAY TAT     Status: None   Collection Time: 12/08/19  9:09 AM   Nasopharynge  Screenin  Result Value Ref Range Status   SARS-CoV-2, NAA 2 DAY TAT Performed  Final    Labs: CBC: Recent Labs  Lab 08/05/23 0444 08/06/23 0425 08/07/23 0357 08/08/23 0552  WBC 10.4 8.5 6.5 5.9  HGB 16.0 14.9 14.2 14.8  HCT 50.1 49.3 45.2 47.3  MCV 88.2 93.5 90.0 90.8  PLT 191 151 165 188   Basic Metabolic Panel: Recent Labs  Lab 08/05/23 0444 08/06/23 0425 08/07/23 0357 08/08/23 0552 08/08/23 1510  NA 141 134* 137 137 139  K 4.3 4.1 3.8 4.1 3.8  CL 106 106 107 106 105  CO2 22 21* 24 25 25   GLUCOSE 162* 101* 97 100* 107*  BUN 23 16 17  25* 24*  CREATININE 1.33* 1.11 1.20 1.47* 1.42*  CALCIUM  10.5* 8.4* 8.3* 8.4* 8.7*   Liver Function Tests: Recent Labs  Lab 08/05/23 0444 08/06/23 0425 08/07/23 0357 08/08/23 0552  AST 30 19 19  32  ALT 33 22 19 28   ALKPHOS 82 56 54 54  BILITOT 1.0 1.7* 2.2* 1.4*  PROT 7.5 6.3* 6.2* 6.2*  ALBUMIN 3.7 2.8* 2.6* 2.6*   CBG: No results for input(s): "GLUCAP" in the last 168 hours.  Discharge time spent: less than 30 minutes.  Signed: Cherylle Corwin, MD [facility] 08/08/2023

## 2023-08-13 DIAGNOSIS — Z09 Encounter for follow-up examination after completed treatment for conditions other than malignant neoplasm: Secondary | ICD-10-CM | POA: Diagnosis not present

## 2023-08-13 DIAGNOSIS — R609 Edema, unspecified: Secondary | ICD-10-CM | POA: Diagnosis not present

## 2023-08-13 DIAGNOSIS — L0390 Cellulitis, unspecified: Secondary | ICD-10-CM | POA: Diagnosis not present

## 2023-08-13 DIAGNOSIS — R899 Unspecified abnormal finding in specimens from other organs, systems and tissues: Secondary | ICD-10-CM | POA: Diagnosis not present

## 2023-08-13 DIAGNOSIS — Z6838 Body mass index (BMI) 38.0-38.9, adult: Secondary | ICD-10-CM | POA: Diagnosis not present

## 2023-08-13 DIAGNOSIS — N1831 Chronic kidney disease, stage 3a: Secondary | ICD-10-CM | POA: Diagnosis not present

## 2023-08-13 DIAGNOSIS — K859 Acute pancreatitis without necrosis or infection, unspecified: Secondary | ICD-10-CM | POA: Diagnosis not present

## 2023-08-14 ENCOUNTER — Emergency Department (HOSPITAL_BASED_OUTPATIENT_CLINIC_OR_DEPARTMENT_OTHER)

## 2023-08-14 ENCOUNTER — Inpatient Hospital Stay (HOSPITAL_BASED_OUTPATIENT_CLINIC_OR_DEPARTMENT_OTHER)
Admission: EM | Admit: 2023-08-14 | Discharge: 2023-08-19 | DRG: 439 | Disposition: A | Discharge status: Home / Self Care | Attending: Internal Medicine | Admitting: Internal Medicine

## 2023-08-14 ENCOUNTER — Other Ambulatory Visit: Payer: Self-pay

## 2023-08-14 ENCOUNTER — Encounter (HOSPITAL_BASED_OUTPATIENT_CLINIC_OR_DEPARTMENT_OTHER): Payer: Self-pay

## 2023-08-14 DIAGNOSIS — K219 Gastro-esophageal reflux disease without esophagitis: Secondary | ICD-10-CM | POA: Diagnosis present

## 2023-08-14 DIAGNOSIS — Z8041 Family history of malignant neoplasm of ovary: Secondary | ICD-10-CM

## 2023-08-14 DIAGNOSIS — N1831 Chronic kidney disease, stage 3a: Secondary | ICD-10-CM | POA: Diagnosis present

## 2023-08-14 DIAGNOSIS — K8500 Idiopathic acute pancreatitis without necrosis or infection: Secondary | ICD-10-CM

## 2023-08-14 DIAGNOSIS — E785 Hyperlipidemia, unspecified: Secondary | ICD-10-CM | POA: Diagnosis present

## 2023-08-14 DIAGNOSIS — I129 Hypertensive chronic kidney disease with stage 1 through stage 4 chronic kidney disease, or unspecified chronic kidney disease: Secondary | ICD-10-CM | POA: Diagnosis present

## 2023-08-14 DIAGNOSIS — K5730 Diverticulosis of large intestine without perforation or abscess without bleeding: Secondary | ICD-10-CM | POA: Diagnosis not present

## 2023-08-14 DIAGNOSIS — J4489 Other specified chronic obstructive pulmonary disease: Secondary | ICD-10-CM | POA: Diagnosis present

## 2023-08-14 DIAGNOSIS — K8591 Acute pancreatitis with uninfected necrosis, unspecified: Secondary | ICD-10-CM | POA: Diagnosis not present

## 2023-08-14 DIAGNOSIS — Z6837 Body mass index (BMI) 37.0-37.9, adult: Secondary | ICD-10-CM

## 2023-08-14 DIAGNOSIS — Z96653 Presence of artificial knee joint, bilateral: Secondary | ICD-10-CM | POA: Diagnosis present

## 2023-08-14 DIAGNOSIS — Z87891 Personal history of nicotine dependence: Secondary | ICD-10-CM

## 2023-08-14 DIAGNOSIS — Z79899 Other long term (current) drug therapy: Secondary | ICD-10-CM

## 2023-08-14 DIAGNOSIS — Z87442 Personal history of urinary calculi: Secondary | ICD-10-CM

## 2023-08-14 DIAGNOSIS — N179 Acute kidney failure, unspecified: Secondary | ICD-10-CM | POA: Diagnosis present

## 2023-08-14 DIAGNOSIS — Z888 Allergy status to other drugs, medicaments and biological substances status: Secondary | ICD-10-CM

## 2023-08-14 DIAGNOSIS — K861 Other chronic pancreatitis: Secondary | ICD-10-CM | POA: Diagnosis present

## 2023-08-14 DIAGNOSIS — K859 Acute pancreatitis without necrosis or infection, unspecified: Principal | ICD-10-CM | POA: Diagnosis present

## 2023-08-14 DIAGNOSIS — Z7951 Long term (current) use of inhaled steroids: Secondary | ICD-10-CM

## 2023-08-14 DIAGNOSIS — I700 Atherosclerosis of aorta: Secondary | ICD-10-CM | POA: Diagnosis present

## 2023-08-14 DIAGNOSIS — K862 Cyst of pancreas: Secondary | ICD-10-CM | POA: Diagnosis present

## 2023-08-14 DIAGNOSIS — E66812 Obesity, class 2: Secondary | ICD-10-CM | POA: Diagnosis present

## 2023-08-14 DIAGNOSIS — J449 Chronic obstructive pulmonary disease, unspecified: Secondary | ICD-10-CM | POA: Diagnosis not present

## 2023-08-14 DIAGNOSIS — M25571 Pain in right ankle and joints of right foot: Secondary | ICD-10-CM | POA: Diagnosis not present

## 2023-08-14 LAB — COMPREHENSIVE METABOLIC PANEL WITH GFR
ALT: 19 U/L (ref 0–44)
AST: 22 U/L (ref 15–41)
Albumin: 3.7 g/dL (ref 3.5–5.0)
Alkaline Phosphatase: 77 U/L (ref 38–126)
Anion gap: 13 (ref 5–15)
BUN: 26 mg/dL — ABNORMAL HIGH (ref 8–23)
CO2: 25 mmol/L (ref 22–32)
Calcium: 10.1 mg/dL (ref 8.9–10.3)
Chloride: 104 mmol/L (ref 98–111)
Creatinine, Ser: 1.41 mg/dL — ABNORMAL HIGH (ref 0.61–1.24)
GFR, Estimated: 51 mL/min — ABNORMAL LOW (ref >60)
Glucose, Bld: 137 mg/dL — ABNORMAL HIGH (ref 70–99)
Potassium: 4.6 mmol/L (ref 3.5–5.1)
Sodium: 141 mmol/L (ref 135–145)
Total Bilirubin: 1 mg/dL (ref 0.0–1.2)
Total Protein: 7.5 g/dL (ref 6.5–8.1)

## 2023-08-14 LAB — URINALYSIS, ROUTINE W REFLEX MICROSCOPIC
Bacteria, UA: NONE SEEN
Bilirubin Urine: NEGATIVE
Glucose, UA: NEGATIVE mg/dL
Hgb urine dipstick: NEGATIVE
Ketones, ur: NEGATIVE mg/dL
Leukocytes,Ua: NEGATIVE
Nitrite: NEGATIVE
Specific Gravity, Urine: 1.021 (ref 1.005–1.030)
pH: 7 (ref 5.0–8.0)

## 2023-08-14 LAB — LIPASE, BLOOD: Lipase: >2800 U/L — ABNORMAL HIGH (ref 11–51)

## 2023-08-14 LAB — CBC
HCT: 51 % (ref 39.0–52.0)
Hemoglobin: 16.1 g/dL (ref 13.0–17.0)
MCH: 28.1 pg (ref 26.0–34.0)
MCHC: 31.6 g/dL (ref 30.0–36.0)
MCV: 89 fL (ref 80.0–100.0)
Platelets: 209 10*3/uL (ref 150–400)
RBC: 5.73 MIL/uL (ref 4.22–5.81)
RDW: 13.2 % (ref 11.5–15.5)
WBC: 9.9 10*3/uL (ref 4.0–10.5)
nRBC: 0 % (ref 0.0–0.2)

## 2023-08-14 MED ORDER — ENOXAPARIN SODIUM 40 MG/0.4ML IJ SOSY
40.0000 mg | PREFILLED_SYRINGE | INTRAMUSCULAR | Status: DC
  Administered 2023-08-15 – 2023-08-19 (×5): 40 mg via SUBCUTANEOUS
  Filled 2023-08-14 (×5): qty 0.4

## 2023-08-14 MED ORDER — ONDANSETRON HCL 4 MG/2ML IJ SOLN
4.0000 mg | Freq: Four times a day (QID) | INTRAMUSCULAR | Status: DC | PRN
Start: 2023-08-14 — End: 2023-08-19
  Administered 2023-08-15: 4 mg via INTRAVENOUS
  Filled 2023-08-14 (×2): qty 2

## 2023-08-14 MED ORDER — ONDANSETRON HCL 4 MG/2ML IJ SOLN
4.0000 mg | Freq: Once | INTRAMUSCULAR | Status: AC
  Administered 2023-08-14: 4 mg via INTRAVENOUS
  Filled 2023-08-14: qty 2

## 2023-08-14 MED ORDER — LACTATED RINGERS IV BOLUS
1000.0000 mL | Freq: Once | INTRAVENOUS | Status: AC
  Administered 2023-08-14: 1000 mL via INTRAVENOUS

## 2023-08-14 MED ORDER — DEXTROSE IN LACTATED RINGERS 5 % IV SOLN: INTRAVENOUS | Status: AC

## 2023-08-14 MED ORDER — HYDROMORPHONE HCL 1 MG/ML IJ SOLN
1.0000 mg | Freq: Once | INTRAMUSCULAR | Status: AC
  Administered 2023-08-14: 1 mg via INTRAVENOUS
  Filled 2023-08-14: qty 1

## 2023-08-14 MED ORDER — IOHEXOL 300 MG/ML  SOLN
100.0000 mL | Freq: Once | INTRAMUSCULAR | Status: AC | PRN
  Administered 2023-08-14: 100 mL via INTRAVENOUS

## 2023-08-14 MED ORDER — HYDROMORPHONE HCL 1 MG/ML IJ SOLN
0.5000 mg | INTRAMUSCULAR | Status: DC | PRN
  Administered 2023-08-14 – 2023-08-15 (×2): 0.5 mg via INTRAVENOUS
  Filled 2023-08-14 (×3): qty 0.5

## 2023-08-14 MED ORDER — DEXTROSE 5 % IV SOLN: INTRAVENOUS | Status: AC

## 2023-08-14 MED ORDER — ONDANSETRON HCL 4 MG PO TABS: 4.0000 mg | ORAL_TABLET | Freq: Four times a day (QID) | ORAL | Status: DC | PRN

## 2023-08-14 NOTE — ED Provider Notes (Signed)
 [place] EMERGENCY DEPARTMENT AT [facility] Provider Note   CSN: [id number] Arrival date & time: 08/14/23  1737     History  Chief Complaint  Patient presents with   Abdominal Pain   Nausea    Joseph Bates is a 78 y.o. male.  78 year old male here today for epigastric pain that started earlier this morning.  He was admitted for first onset of acute pancreatitis at the this past month.  During that hospitalization he had MRIs of the abdomen, ultrasounds all of which were negative.  Patient without any significant alcohol history, no elevated triglycerides.   Abdominal Pain      Home Medications Prior to Admission medications   Medication Sig Start Date End Date Taking? Authorizing Provider  albuterol  (PROVENTIL  HFA;VENTOLIN  HFA) 108 (90 Base) MCG/ACT inhaler Every 4-6 hours prn. Patient taking differently: Inhale 2 puffs into the lungs as needed for wheezing or shortness of breath. 11/26/15   Diamond Formica, MD  atorvastatin  (LIPITOR) 10 MG tablet Take 10 mg by mouth at bedtime.    [provider]  budesonide -formoterol  (SYMBICORT ) 160-4.5 MCG/ACT inhaler Take 2 puffs first thing in am and then another 2 puffs about 12 hours later. Patient taking differently: Inhale 2 puffs into the lungs 2 (two) times daily. 11/26/15   Diamond Formica, MD  cephALEXin  (KEFLEX ) 500 MG capsule Take 500 mg by mouth 4 (four) times daily. 07/30/23   [provider]  doxycycline  (VIBRA -TABS) 100 MG tablet Take 100 mg by mouth 2 (two) times daily. 08/01/23   [provider]      Allergies    Naproxen sodium    Review of Systems   Review of Systems  Gastrointestinal:  Positive for abdominal pain.    Physical Exam Updated Vital Signs BP (!) 170/95 (BP Location: Left Arm)   Pulse 70   Temp 97.9 F (36.6 C)   Resp 17   Ht 5\' 9"  (1.753 m)   Wt 114.3 kg   SpO2 96%   BMI 37.21 kg/m  Physical Exam Vitals and nursing note reviewed.  Abdominal:      General: Abdomen is flat.     Palpations: Abdomen is soft.     Tenderness: There is abdominal tenderness in the epigastric area.     ED Results / Procedures / Treatments   Labs (all labs ordered are listed, but only abnormal results are displayed) Labs Reviewed  LIPASE, BLOOD - Abnormal; Notable for the following components:      Result Value   Lipase >2,800 (*)    All other components within normal limits  COMPREHENSIVE METABOLIC PANEL WITH GFR - Abnormal; Notable for the following components:   Glucose, Bld 137 (*)    BUN 26 (*)    Creatinine, Ser 1.41 (*)    GFR, Estimated 51 (*)    All other components within normal limits  URINALYSIS, ROUTINE W REFLEX MICROSCOPIC - Abnormal; Notable for the following components:   Protein, ur TRACE (*)    All other components within normal limits  CBC    EKG None  Radiology CT ABDOMEN PELVIS W CONTRAST Result Date: 08/14/2023 CLINICAL DATA:  Abdominal pain. EXAM: CT ABDOMEN AND PELVIS WITH CONTRAST TECHNIQUE: Multidetector CT imaging of the abdomen and pelvis was performed using the standard protocol following bolus administration of intravenous contrast. RADIATION DOSE REDUCTION: This exam was performed according to the departmental dose-optimization program which includes automated exposure control, adjustment of the mA and/or kV according to  patient size and/or use of iterative reconstruction technique. CONTRAST:  OMNIPAQUE  IOHEXOL  300 MG/ML  SOLN COMPARISON:  CT abdomen pelvis dated 08/05/2023. abdominal MRI dated 07/28/2023. FINDINGS: Lower chest: The visualized lung bases are clear. No intra-abdominal free air or free fluid. Hepatobiliary: The liver is unremarkable.  No calcified gallstone. Pancreas: There is diffuse peripancreatic edema in keeping with acute on chronic pancreatitis. Scattered low attenuating lesions throughout the pancreas with mild dilatation of the main pancreatic duct better evaluated on the MRI. Findings may be  sequela of chronic pancreatitis with small pseudocyst or IPMN or other mucinous lesions. EUS was recommended on prior MRI. Spleen: Normal in size without focal abnormality. Adrenals/Urinary Tract: The adrenal glands are unremarkable. Mild bilateral renal parenchyma atrophy. Small right renal cysts. There is no hydronephrosis on either side. There is symmetric enhancement and excretion of contrast by both kidneys. The visualized ureters and urinary bladder unremarkable. Stomach/Bowel: There is distal colonic diverticulosis. There is no bowel obstruction or active inflammation. The appendix is normal. Vascular/Lymphatic: Moderate aortoiliac atherosclerotic disease. The IVC is unremarkable. No portal venous gas. There is no adenopathy. Reproductive: The prostate and seminal vesicles are grossly unremarkable. Other: None Musculoskeletal: Osteopenia with degenerative changes of the spine. No acute osseous pathology. IMPRESSION: 1. Acute on chronic pancreatitis. 2. Scattered low attenuating lesions throughout the pancreas with mild dilatation of the main pancreatic duct better evaluated on the MRI. EUS was recommended on prior MRI. 3. Distal colonic diverticulosis. No bowel obstruction. Normal appendix. 4.  Aortic Atherosclerosis (ICD10-I70.0). Electronically Signed   By: Angus Bark M.D.   On: 08/14/2023 19:52    Procedures Procedures    Medications Ordered in ED Medications  HYDROmorphone  (DILAUDID ) injection 1 mg (has no administration in time range)  ondansetron  (ZOFRAN ) injection 4 mg (has no administration in time range)  lactated ringers  bolus 1,000 mL (has no administration in time range)  dextrose  5 % solution (has no administration in time range)  iohexol  (OMNIPAQUE ) 300 MG/ML solution 100 mL (100 mLs Intravenous Contrast Given 08/14/23 1940)    ED Course/ Medical Decision Making/ A&P                                 Medical Decision Making 78 year old male here today with epigastric  pain.  Plan-patient has acute pancreatitis.  His lipase is greater than 20,000.  CT imaging is consistent with acute pancreatitis.  No clear trigger for this patient's pancreatitis.  Unfortunately, patient's symptoms remain quite severe.  He will require continued IV fluids, IV a analgesia and admission.  I reviewed the patient's latest hospital admission and discharge notes.  Reviewed GI notes.  Amount and/or Complexity of Data Reviewed Labs: ordered. Radiology: ordered.  Risk Prescription drug management.           Final Clinical Impression(s) / ED Diagnoses Final diagnoses:  Acute pancreatitis without infection or necrosis, unspecified pancreatitis type    Rx / DC Orders ED Discharge Orders     None         Afton Horse T, DO 08/14/23 2019

## 2023-08-14 NOTE — Progress Notes (Signed)
 Plan of Care Note for accepted transfer   Patient: Joseph Bates MRN: [id number]   DOA: 08/14/2023  Facility requesting transfer: [facility] Requesting Provider: Nathanael Baker, DO Reason for transfer: Acute pancreatitis Facility course: 78 year old male here today for epigastric pain that started earlier this morning.  He was admitted for first onset of acute pancreatitis at the this past month.  During that hospitalization he had MRIs of the abdomen, ultrasounds all of which were negative.  Patient without any significant alcohol history, no elevated triglycerides.  When he came to the ER BP was 170/95 with otherwise normal vital signs.  Labs revealed A BUN of 26 with a creatinine 1.41 close to previous levels and serum lipase more than 2800 with blood glucose of 137.  CBC was within normal.  UA showed trace protein.  Abdominal and pelvic CT scan with contrast showed acute on chronic pancreatitis with scattered low attenuating lesions throughout the pancreas with mild dilatation of the the main pancreatic duct better evaluated on MRI.  EUS was recommended on prior MRI.  It showed distal colonic diverticulosis with no bowel obstruction.  Appendix was normal.  It showed aortic atherosclerosis.  The patient was given 1 mg of IV Dilaudid , 1 L bolus of IV lactated Ringer  and 4 mg of IV Zofran .  Plan of care: The patient is accepted for admission to Med-surg  unit, at [facility]..  The patient will be under the care and supervision of the EDP until arrival to [facility].  Author: Virgene Griffin, MD 08/14/2023  Check [email or web address] for on-call coverage.  Nursing staff, Please call [facility] Admits & Consults System-Wide number on Amion as soon as patient's arrival, so appropriate admitting provider can evaluate the pt.

## 2023-08-14 NOTE — Plan of Care (Signed)
  Problem: Education: Goal: Knowledge of General Education information will improve Description: Including pain rating scale, medication(s)/side effects and non-pharmacologic comfort measures Outcome: Progressing   Problem: Activity: Goal: Risk for activity intolerance will decrease Outcome: Progressing   Problem: Nutrition: Goal: Adequate nutrition will be maintained Outcome: Progressing   Problem: Pain Managment: Goal: General experience of comfort will improve and/or be controlled Outcome: Progressing   Problem: Safety: Goal: Ability to remain free from injury will improve Outcome: Progressing   Problem: Safety: Goal: Ability to remain free from injury will improve Outcome: Progressing

## 2023-08-14 NOTE — H&P (Signed)
 History and Physical    Patient: Joseph Bates EXB:[id number] DOB: May 17, 1945 DOA: 08/14/2023 DOS: the patient was seen and examined on 08/14/2023 PCP: Jimmey Mould, MD  Patient coming from: Home  Chief Complaint:  Chief Complaint  Patient presents with   Abdominal Pain   Nausea   HPI: Joseph Bates is a 78 y.o. male with medical history significant of recently diagnosed acute pancreatitis, with extensive workup that showed no obvious cause.  CT and MRI abdomen showed that patient has progressed to chronic pancreatitis.  No pancreatic duct mass but multiple cystic lesions.  Was seen by GI and followed by [facility] with plan to have outpatient EGD.[name]  Patient was doing well since discharge on May 31.  Yesterday they went out to dinner, they ate outside and also back at home and he he think he ate a lot of.  Around 1130 this morning he started having epigastric pain, right upper quadrant pain.  Patient called his GI doctor and was told to go to the [facility].  He was diagnosed with another episode of acute pancreatitis.  Patient being admitted to our service.  Review of Systems: As mentioned in the history of present illness. All other systems reviewed and are negative. Past Medical History:  Diagnosis Date   Arthritis    Asthma    COPD (chronic obstructive pulmonary disease) (HCC)    Dyspnea    only if he does not do his symbicort    GERD (gastroesophageal reflux disease)    occasionaly   History of kidney stones    Hyperlipidemia    Past Surgical History:  Procedure Laterality Date   HERNIA REPAIR     INSERTION OF MESH N/A 09/29/2017   Procedure: INSERTION OF MESH;  Surgeon: Shela Derby, MD;  Location: [facility] OR;  Service: General;  Laterality: N/A;   JOINT REPLACEMENT Bilateral    knee   UMBILICAL HERNIA REPAIR N/A 09/29/2017   Procedure: LAPAROSCOPIC UMBILICAL HERNIA WITH MESH;  Surgeon: Shela Derby, MD;  Location: [facility] OR;  Service: General;  Laterality: N/A;   vocal  cord surgery     Social History:  reports that he quit smoking about 17 years ago. His smoking use included cigarettes. He started smoking about 57 years ago. He has a 80 pack-year smoking history. He has never used smokeless tobacco. He reports current alcohol use. He reports that he does not use drugs.  Allergies  Allergen Reactions   Naproxen Sodium Itching    Family History  Problem Relation Age of Onset   Ovarian cancer Maternal Grandmother     Prior to Admission medications   Medication Sig Start Date End Date Taking? Authorizing Provider  albuterol  (PROVENTIL  HFA;VENTOLIN  HFA) 108 (90 Base) MCG/ACT inhaler Every 4-6 hours prn. Patient taking differently: Inhale 2 puffs into the lungs as needed for wheezing or shortness of breath. 11/26/15   Diamond Formica, MD  atorvastatin  (LIPITOR) 10 MG tablet Take 10 mg by mouth at bedtime.    [provider]  budesonide -formoterol  (SYMBICORT ) 160-4.5 MCG/ACT inhaler Take 2 puffs first thing in am and then another 2 puffs about 12 hours later. Patient taking differently: Inhale 2 puffs into the lungs 2 (two) times daily. 11/26/15   Diamond Formica, MD  cephALEXin  (KEFLEX ) 500 MG capsule Take 500 mg by mouth 4 (four) times daily. 07/30/23   [provider]  doxycycline  (VIBRA -TABS) 100 MG tablet Take 100 mg by mouth 2 (two) times daily. 08/01/23  [provider]    Physical Exam: Vitals:   08/14/23 1921 08/14/23 2030 08/14/23 2100 08/14/23 2211  BP:  (!) 169/90 (!) 170/93 (!) 145/90  Pulse:  (!) 57 (!) 56 (!) 59  Resp:    18  Temp:    97.8 F (36.6 C)  TempSrc:    Oral  SpO2:  91% 98% 97%  Weight: 114.3 kg     Height: 5\' 9"  (1.753 m)      Constitutional: Acutely ill looking, NAD, calm, comfortable Eyes: PERRL, lids and conjunctivae normal ENMT: Mucous membranes are moist. Posterior pharynx clear of any exudate or lesions.Normal dentition.  Neck: normal, supple, no masses, no thyromegaly Respiratory: clear to  auscultation bilaterally, no wheezing, no crackles. Normal respiratory effort. No accessory muscle use.  Cardiovascular: Regular rate and rhythm, no murmurs / rubs / gallops. No extremity edema. 2+ pedal pulses. No carotid bruits.  Abdomen: Soft nondistended with significant epigastric tenderness:, No masses palpated. No hepatosplenomegaly. Bowel sounds positive.  Musculoskeletal: Good range of motion, no joint swelling or tenderness, Skin: no rashes, lesions, ulcers. No induration Neurologic: CN 2-12 grossly intact. Sensation intact, DTR normal. Strength 5/5 in all 4.  Psychiatric: Normal judgment and insight. Alert and oriented x 3. Normal mood  Data Reviewed:  Blood pressure 169/90 with a pulse 57,Glucose 137, BUN 26 creatinine 1.41, lipase more than 2800, urinalysis negative.  CT abdomen and pelvis showed acute on chronic pancreatitis with scattered low-attenuation lesions throughout the pancreas with mild dilatation of the main pancreatic duct distal colon diverticulosis.  Assessment and Plan:  #1 acute on chronic pancreatitis: Unknown cause.  Patient will need EUS.  Patient will be admitted for pain management, bowel rest, control of nausea vomiting.  Follow lipase level.  #2 essential hypertension: Continue blood pressure medications once stable.  Currently IV only.  #3 chronic kidney disease stage III: Monitor renal function.  #4 hyperlipidemia: Will resume dietary counseling after oral intake resumes.  #5 AKI: Hydrate and monitor renal function    Advance Care Planning:   Code Status: Full Code   Consults: [facility]  Family Communication: No family at bedside  Severity of Illness: The appropriate patient status for this patient is INPATIENT. Inpatient status is judged to be reasonable and necessary in order to provide the required intensity of service to ensure the patient's safety. The patient's presenting symptoms, physical exam findings, and initial radiographic and  laboratory data in the context of their chronic comorbidities is felt to place them at high risk for further clinical deterioration. Furthermore, it is not anticipated that the patient will be medically stable for discharge from the hospital within 2 midnights of admission.   * I certify that at the point of admission it is my clinical judgment that the patient will require inpatient hospital care spanning beyond 2 midnights from the point of admission due to high intensity of service, high risk for further deterioration and high frequency of surveillance required.*  AuthorCarolin Chyle, MD 08/14/2023 10:24 PM  For on call review [email or web address].

## 2023-08-14 NOTE — ED Triage Notes (Signed)
 Pt c/o abd pain "just like last time." Admitted 5/26-5/28 for pancreatitis. Associated nausea, "it just won't get better."

## 2023-08-15 DIAGNOSIS — N1831 Chronic kidney disease, stage 3a: Secondary | ICD-10-CM | POA: Diagnosis not present

## 2023-08-15 DIAGNOSIS — K5730 Diverticulosis of large intestine without perforation or abscess without bleeding: Secondary | ICD-10-CM | POA: Diagnosis not present

## 2023-08-15 DIAGNOSIS — K219 Gastro-esophageal reflux disease without esophagitis: Secondary | ICD-10-CM | POA: Diagnosis not present

## 2023-08-15 DIAGNOSIS — Z6837 Body mass index (BMI) 37.0-37.9, adult: Secondary | ICD-10-CM | POA: Diagnosis not present

## 2023-08-15 DIAGNOSIS — I129 Hypertensive chronic kidney disease with stage 1 through stage 4 chronic kidney disease, or unspecified chronic kidney disease: Secondary | ICD-10-CM | POA: Diagnosis not present

## 2023-08-15 DIAGNOSIS — M25571 Pain in right ankle and joints of right foot: Secondary | ICD-10-CM | POA: Diagnosis not present

## 2023-08-15 DIAGNOSIS — K859 Acute pancreatitis without necrosis or infection, unspecified: Secondary | ICD-10-CM | POA: Diagnosis not present

## 2023-08-15 DIAGNOSIS — Z7951 Long term (current) use of inhaled steroids: Secondary | ICD-10-CM | POA: Diagnosis not present

## 2023-08-15 DIAGNOSIS — Z79899 Other long term (current) drug therapy: Secondary | ICD-10-CM | POA: Diagnosis not present

## 2023-08-15 DIAGNOSIS — K861 Other chronic pancreatitis: Secondary | ICD-10-CM | POA: Diagnosis not present

## 2023-08-15 DIAGNOSIS — Z888 Allergy status to other drugs, medicaments and biological substances status: Secondary | ICD-10-CM | POA: Diagnosis not present

## 2023-08-15 DIAGNOSIS — K862 Cyst of pancreas: Secondary | ICD-10-CM | POA: Diagnosis not present

## 2023-08-15 DIAGNOSIS — Z87442 Personal history of urinary calculi: Secondary | ICD-10-CM | POA: Diagnosis not present

## 2023-08-15 DIAGNOSIS — Z87891 Personal history of nicotine dependence: Secondary | ICD-10-CM | POA: Diagnosis not present

## 2023-08-15 DIAGNOSIS — E66812 Obesity, class 2: Secondary | ICD-10-CM | POA: Diagnosis not present

## 2023-08-15 DIAGNOSIS — Z96653 Presence of artificial knee joint, bilateral: Secondary | ICD-10-CM | POA: Diagnosis not present

## 2023-08-15 DIAGNOSIS — I700 Atherosclerosis of aorta: Secondary | ICD-10-CM | POA: Diagnosis not present

## 2023-08-15 DIAGNOSIS — J449 Chronic obstructive pulmonary disease, unspecified: Secondary | ICD-10-CM | POA: Diagnosis not present

## 2023-08-15 DIAGNOSIS — E785 Hyperlipidemia, unspecified: Secondary | ICD-10-CM | POA: Diagnosis not present

## 2023-08-15 DIAGNOSIS — J4489 Other specified chronic obstructive pulmonary disease: Secondary | ICD-10-CM | POA: Diagnosis not present

## 2023-08-15 DIAGNOSIS — Z8041 Family history of malignant neoplasm of ovary: Secondary | ICD-10-CM | POA: Diagnosis not present

## 2023-08-15 DIAGNOSIS — R109 Unspecified abdominal pain: Secondary | ICD-10-CM | POA: Diagnosis not present

## 2023-08-15 DIAGNOSIS — N179 Acute kidney failure, unspecified: Secondary | ICD-10-CM | POA: Diagnosis not present

## 2023-08-15 DIAGNOSIS — R933 Abnormal findings on diagnostic imaging of other parts of digestive tract: Secondary | ICD-10-CM | POA: Diagnosis not present

## 2023-08-15 LAB — COMPREHENSIVE METABOLIC PANEL WITH GFR
ALT: 17 U/L (ref 0–44)
AST: 16 U/L (ref 15–41)
Albumin: 2.8 g/dL — ABNORMAL LOW (ref 3.5–5.0)
Alkaline Phosphatase: 55 U/L (ref 38–126)
Anion gap: 6 (ref 5–15)
BUN: 22 mg/dL (ref 8–23)
CO2: 26 mmol/L (ref 22–32)
Calcium: 8.7 mg/dL — ABNORMAL LOW (ref 8.9–10.3)
Chloride: 107 mmol/L (ref 98–111)
Creatinine, Ser: 1.34 mg/dL — ABNORMAL HIGH (ref 0.61–1.24)
GFR, Estimated: 55 mL/min — ABNORMAL LOW (ref >60)
Glucose, Bld: 116 mg/dL — ABNORMAL HIGH (ref 70–99)
Potassium: 4.2 mmol/L (ref 3.5–5.1)
Sodium: 139 mmol/L (ref 135–145)
Total Bilirubin: 1.2 mg/dL (ref 0.0–1.2)
Total Protein: 6 g/dL — ABNORMAL LOW (ref 6.5–8.1)

## 2023-08-15 LAB — CBC
HCT: 49.4 % (ref 39.0–52.0)
Hemoglobin: 14.8 g/dL (ref 13.0–17.0)
MCH: 28.2 pg (ref 26.0–34.0)
MCHC: 30 g/dL (ref 30.0–36.0)
MCV: 94.1 fL (ref 80.0–100.0)
Platelets: 182 10*3/uL (ref 150–400)
RBC: 5.25 MIL/uL (ref 4.22–5.81)
RDW: 13.3 % (ref 11.5–15.5)
WBC: 7.5 10*3/uL (ref 4.0–10.5)
nRBC: 0 % (ref 0.0–0.2)

## 2023-08-15 MED ORDER — LABETALOL HCL 5 MG/ML IV SOLN: 10.0000 mg | INTRAVENOUS | Status: DC | PRN

## 2023-08-15 MED ORDER — PANTOPRAZOLE SODIUM 40 MG IV SOLR
40.0000 mg | Freq: Every day | INTRAVENOUS | Status: DC
  Administered 2023-08-15 – 2023-08-16 (×3): 40 mg via INTRAVENOUS
  Filled 2023-08-15 (×3): qty 10

## 2023-08-15 MED ORDER — FLUTICASONE FUROATE-VILANTEROL 100-25 MCG/ACT IN AEPB
1.0000 | INHALATION_SPRAY | Freq: Every day | RESPIRATORY_TRACT | Status: DC
  Administered 2023-08-15 – 2023-08-19 (×5): 1 via RESPIRATORY_TRACT
  Filled 2023-08-15: qty 28

## 2023-08-15 MED ORDER — ALBUTEROL SULFATE (2.5 MG/3ML) 0.083% IN NEBU: 2.5000 mg | INHALATION_SOLUTION | RESPIRATORY_TRACT | Status: DC | PRN

## 2023-08-15 MED ORDER — ATORVASTATIN CALCIUM 10 MG PO TABS
10.0000 mg | ORAL_TABLET | Freq: Every day | ORAL | Status: DC
  Administered 2023-08-15 – 2023-08-18 (×4): 10 mg via ORAL
  Filled 2023-08-15 (×4): qty 1

## 2023-08-15 NOTE — Progress Notes (Signed)
 Mobility Specialist - Progress Note   08/15/23 0918  Mobility  Activity Ambulated independently in hallway  Level of Assistance Independent  Assistive Device None  Distance Ambulated (ft) 500 ft  Activity Response Tolerated well  Mobility Referral Yes  Mobility visit 1 Mobility  Mobility Specialist Start Time (ACUTE ONLY) 0910  Mobility Specialist Stop Time (ACUTE ONLY) [id number]  Mobility Specialist Time Calculation (min) (ACUTE ONLY) 8 min   Pt received in bed and agreeable to mobility. No complaints during session. Pt to recliner after session with all needs met.    [facility]

## 2023-08-15 NOTE — Consult Note (Signed)
 [facility] Consultation Note  Referring Provider: [facility] Primary Care Physician:  Jimmey Mould, MD Primary Gastroenterologist:  [facility]  Reason for Consultation:  abdominal pain  HPI: Joseph Bates is a 78 y.o. male admitted recurrent abdominal pain with recurrent pancreatitis.  Prior similar admission couple weeks ago.  Plans for outpatient EUS, and patient did well post-discharge until yesterday morning when he had recurrent abdominal pain.   Past Medical History:  Diagnosis Date   Arthritis    Asthma    COPD (chronic obstructive pulmonary disease) (HCC)    Dyspnea    only if he does not do his symbicort    GERD (gastroesophageal reflux disease)    occasionaly   History of kidney stones    Hyperlipidemia     Past Surgical History:  Procedure Laterality Date   HERNIA REPAIR     INSERTION OF MESH N/A 09/29/2017   Procedure: INSERTION OF MESH;  Surgeon: Shela Derby, MD;  Location: [facility] OR;  Service: General;  Laterality: N/A;   JOINT REPLACEMENT Bilateral    knee   UMBILICAL HERNIA REPAIR N/A 09/29/2017   Procedure: LAPAROSCOPIC UMBILICAL HERNIA WITH MESH;  Surgeon: Shela Derby, MD;  Location: [facility] OR;  Service: General;  Laterality: N/A;   vocal cord surgery      Prior to Admission medications   Medication Sig Start Date End Date Taking? Authorizing Provider  albuterol  (PROVENTIL  HFA;VENTOLIN  HFA) 108 (90 Base) MCG/ACT inhaler Every 4-6 hours prn. Patient taking differently: Inhale 2 puffs into the lungs as needed for wheezing or shortness of breath. 11/26/15  Yes Diamond Formica, MD  atorvastatin  (LIPITOR) 10 MG tablet Take 10 mg by mouth at bedtime.   Yes [provider]  budesonide -formoterol  (SYMBICORT ) 160-4.5 MCG/ACT inhaler Take 2 puffs first thing in am and then another 2 puffs about 12 hours later. Patient taking differently: Inhale 2 puffs into the lungs 2 (two) times daily. 11/26/15  Yes Diamond Formica, MD  fluticasone   furoate-vilanterol (BREO ELLIPTA ) 200-25 MCG/ACT AEPB Inhale 1 puff into the lungs daily.   Yes [provider]  cephALEXin  (KEFLEX ) 500 MG capsule Take 500 mg by mouth 4 (four) times daily. Patient not taking: Reported on 08/15/2023 07/30/23   [provider]  doxycycline  (VIBRA -TABS) 100 MG tablet Take 100 mg by mouth 2 (two) times daily. Patient not taking: Reported on 08/15/2023 08/01/23   [provider]    Current Facility-Administered Medications  Medication Dose Route Frequency Provider Last Rate Last Admin   albuterol  (PROVENTIL ) (2.5 MG/3ML) 0.083% nebulizer solution 2.5 mg  2.5 mg Nebulization Q4H PRN Verlyn Goad, MD       atorvastatin  (LIPITOR) tablet 10 mg  10 mg Oral QAC supper Verlyn Goad, MD       dextrose  5 % in lactated ringers  infusion   Intravenous Continuous Davida Espy, MD 40 mL/hr at 08/14/23 2245 New Bag at 08/14/23 2245   dextrose  5 % solution   Intravenous Continuous Afton Horse T, DO 40 mL/hr at 08/14/23 2026 New Bag at 08/14/23 2026   enoxaparin  (LOVENOX ) injection 40 mg  40 mg Subcutaneous Q24H Garba, Mohammad L, MD   40 mg at 08/15/23 0908   fluticasone  furoate-vilanterol (BREO ELLIPTA ) 100-25 MCG/ACT 1 puff  1 puff Inhalation Daily Verlyn Goad, MD   1 puff at 08/15/23 1312   HYDROmorphone  (DILAUDID ) injection 0.5 mg  0.5 mg Intravenous Q2H PRN Davida Espy, MD   0.5 mg at 08/14/23  2241   labetalol (NORMODYNE) injection 10 mg  10 mg Intravenous Q10 min PRN Davida Espy, MD       ondansetron  (ZOFRAN ) tablet 4 mg  4 mg Oral Q6H PRN Davida Espy, MD       Or   ondansetron  (ZOFRAN ) injection 4 mg  4 mg Intravenous Q6H PRN Davida Espy, MD       pantoprazole (PROTONIX) injection 40 mg  40 mg Intravenous QHS Davida Espy, MD   40 mg at 08/15/23 0323    Allergies as of 08/14/2023 - Review Complete 08/14/2023  Allergen Reaction Noted   Naproxen sodium Itching     Family History  Problem Relation Age  of Onset   Ovarian cancer Maternal Grandmother     Social History   Socioeconomic History   Marital status: Married    Spouse name: Not on file   Number of children: Not on file   Years of education: Not on file   Highest education level: Not on file  Occupational History   Occupation: PREP ROOM WORKER AT [employer]  Tobacco Use   Smoking status: Former    Current packs/day: 0.00    Average packs/day: 2.0 packs/day for 40.0 years (80.0 ttl pk-yrs)    Types: Cigarettes    Start date: 12/14/1965    Quit date: 12/14/2005    Years since quitting: 17.6   Smokeless tobacco: Never  Vaping Use   Vaping status: Never Used  Substance and Sexual Activity   Alcohol use: Yes    Comment: rarely   Drug use: Never   Sexual activity: Not on file  Other Topics Concern   Not on file  Social History Narrative   Not on file   Social Drivers of Health   Financial Resource Strain: Not on file  Food Insecurity: No Food Insecurity (08/14/2023)   Hunger Vital Sign    Worried About Running Out of Food in the Last Year: Never true    Ran Out of Food in the Last Year: Never true  Transportation Needs: No Transportation Needs (08/14/2023)   PRAPARE - Administrator, Civil Service (Medical): No    Lack of Transportation (Non-Medical): No  Physical Activity: Not on file  Stress: Not on file  Social Connections: Socially Integrated (08/14/2023)   Social Connection and Isolation Panel [NHANES]    Frequency of Communication with Friends and Family: Three times a week    Frequency of Social Gatherings with Friends and Family: Three times a week    Attends Religious Services: More than 4 times per year    Active Member of Clubs or Organizations: Yes    Attends Banker Meetings: More than 4 times per year    Marital Status: Married  Catering manager Violence: Not At Risk (08/14/2023)   Humiliation, Afraid, Rape, and Kick questionnaire    Fear of Current or Ex-Partner: No     Emotionally Abused: No    Physically Abused: No    Sexually Abused: No    Review of Systems: As per HPI, all others negative  Physical Exam: Vital signs in last 24 hours: Temp:  [97.8 F (36.6 C)-98.4 F (36.9 C)] 98.4 F (36.9 C) (06/07 0557) Pulse Rate:  [51-70] 52 (06/07 0557) Resp:  [16-18] 18 (06/07 0557) BP: (145-170)/(80-95) 154/84 (06/07 0557) SpO2:  [91 %-98 %] 97 % (06/07 0557) Weight:  [114.3 kg] 114.3 kg (06/06 1921) Last BM Date : 08/14/23 General:  Alert,  Well-developed, well-nourished, pleasant and cooperative in NAD Head:  Normocephalic and atraumatic. Eyes:  Sclera clear, no icterus.   Conjunctiva pink. Ears:  Normal auditory acuity. Nose:  No deformity, discharge,  or lesions. Mouth:  No deformity or lesions.  Oropharynx pale and dry Neck:  Supple; no masses or thyromegaly. Lungs:  No visible respiratory distress Abdomen:  Soft,protuberant, mild periumbilical and left-sided tenderness with some voluntary guarding; No masses, hepatosplenomegaly or hernias noted. No peritonitis Msk:  Symmetrical without gross deformities. Normal posture. Pulses:  Normal pulses noted. Extremities:  Without clubbing or edema. Neurologic:  Alert and  oriented x4;  grossly normal neurologically. Skin:  Intact without significant lesions or rashes. Psych:  Alert and cooperative. Normal mood and affect.   Lab Results: Recent Labs    08/14/23 1747 08/15/23 0515  WBC 9.9 7.5  HGB 16.1 14.8  HCT 51.0 49.4  PLT 209 182   BMET Recent Labs    08/14/23 1747 08/15/23 0515  NA 141 139  K 4.6 4.2  CL 104 107  CO2 25 26  GLUCOSE 137* 116*  BUN 26* 22  CREATININE 1.41* 1.34*  CALCIUM  10.1 8.7*   LFT Recent Labs    08/15/23 0515  PROT 6.0*  ALBUMIN 2.8*  AST 16  ALT 17  ALKPHOS 55  BILITOT 1.2   PT/INR No results for input(s): "LABPROT", "INR" in the last 72 hours.  Studies/Results: CT ABDOMEN PELVIS W CONTRAST Result Date: 08/14/2023 CLINICAL DATA:   Abdominal pain. EXAM: CT ABDOMEN AND PELVIS WITH CONTRAST TECHNIQUE: Multidetector CT imaging of the abdomen and pelvis was performed using the standard protocol following bolus administration of intravenous contrast. RADIATION DOSE REDUCTION: This exam was performed according to the departmental dose-optimization program which includes automated exposure control, adjustment of the mA and/or kV according to patient size and/or use of iterative reconstruction technique. CONTRAST:  OMNIPAQUE  IOHEXOL  300 MG/ML  SOLN COMPARISON:  CT abdomen pelvis dated 08/05/2023. abdominal MRI dated 07/28/2023. FINDINGS: Lower chest: The visualized lung bases are clear. No intra-abdominal free air or free fluid. Hepatobiliary: The liver is unremarkable.  No calcified gallstone. Pancreas: There is diffuse peripancreatic edema in keeping with acute on chronic pancreatitis. Scattered low attenuating lesions throughout the pancreas with mild dilatation of the main pancreatic duct better evaluated on the MRI. Findings may be sequela of chronic pancreatitis with small pseudocyst or IPMN or other mucinous lesions. EUS was recommended on prior MRI. Spleen: Normal in size without focal abnormality. Adrenals/Urinary Tract: The adrenal glands are unremarkable. Mild bilateral renal parenchyma atrophy. Small right renal cysts. There is no hydronephrosis on either side. There is symmetric enhancement and excretion of contrast by both kidneys. The visualized ureters and urinary bladder unremarkable. Stomach/Bowel: There is distal colonic diverticulosis. There is no bowel obstruction or active inflammation. The appendix is normal. Vascular/Lymphatic: Moderate aortoiliac atherosclerotic disease. The IVC is unremarkable. No portal venous gas. There is no adenopathy. Reproductive: The prostate and seminal vesicles are grossly unremarkable. Other: None Musculoskeletal: Osteopenia with degenerative changes of the spine. No acute osseous pathology.  IMPRESSION: 1. Acute on chronic pancreatitis. 2. Scattered low attenuating lesions throughout the pancreas with mild dilatation of the main pancreatic duct better evaluated on the MRI. EUS was recommended on prior MRI. 3. Distal colonic diverticulosis. No bowel obstruction. Normal appendix. 4.  Aortic Atherosclerosis (ICD10-I70.0). Electronically Signed   By: Angus Bark M.D.   On: 08/14/2023 19:52    Impression:   Recurrent idiopathic acute pancreatitis. Pancreatic  cyst, dilated PD, ? Chronic pancreatitis with pseudocyst.  Plan:   Medical management pancreatitis with IVF, antiemetics, judicious analgesics. Clear liquids for now; would not advance. EUS can be considered middle next week; however the utility, in midst of pancreatitis, will be limited (e.g., we won't be able to aspirate pancreatic cyst given risk for pancreatic fluid leak, and assessment of pancreatic mass much more difficult with higher odds of missing a parenchymal lesion).  However, there is some utility specifically insofar as assessing for choledocholithiasis and gallbladder microlithiasis (prior imaging studies last admission unrevealing). [facility] will revisit Monday.   LOS: 0 days   Natally Ribera M  08/15/2023, 1:32 PM  Cell [phone] If no answer or after 5 PM call [phone]

## 2023-08-15 NOTE — Progress Notes (Addendum)
 [place]  PROGRESS NOTE    [name]  UJW:[id number] DOB: [birth year] DOA: 08/14/2023 PCP: Jimmey Mould, MD   Brief Narrative: Joseph Bates is a 78 y.o. male with a history of COPD chronic pancreatitis, CKD stage IIIa, hyperlipidemia.  Patient presented secondary to pain and nausea and was found to have evidence of acute pancreatitis.  Patient started on bowel rest IV fluids, analgesics for management.   Assessment and Plan:  Acute on chronic pancreatitis Unclear etiology of this episode.  Patient with imaging significant for pancreatic lesions associated dilation with prior recommendation for EUS evaluation patient with abdominal pain and associated lipase greater than 2800 on admission.  Patient managed initially with bowel rest and IV fluids in addition to analgesics.  Symptoms have improved today. - Advance to clear liquid diet today - Gastroenterology consultation  CKD stage IIIa Baseline creatinine appears to be around 1.2-1.4.  Creatinine 1.41 on admission which is not consistent with AKI.  Stable and at baseline.  Primary hypertension Noted.  Patient is not on antihypertensive medication as an outpatient.  Blood pressure is not very well-controlled this admission.  Patient started on labetalol as needed this admission. - Will likely start amlodipine this admission - Continue labetalol  Hyperlipidemia Patient is on Lipitor as an outpatient - Resume home Lipitor  COPD Currently not in exacerbation.  Patient is managed on albuterol  and Breo Ellipta  as an outpatient.  Patient is also listed as taking Symbicort , however this is duplicate to Breo Ellipta  and will likely need to be discontinued on discharge. - Continue Breo Ellipta  and albuterol   Obesity, class II Estimated body mass index is 37.21 kg/m as calculated from the following:   Height as of this encounter: 5\' 9"  (1.753 m).   Weight as of this encounter: 114.3 kg.   DVT prophylaxis: Lovenox  Code Status:    Code Status: Full Code Family Communication: Wife at bedside Disposition Plan: Discharge home pending ability to advance diet successfully in addition to gastroenterology recommendations   Consultants:  [facility] gastroenterology  Procedures:  None  Antimicrobials: None   Subjective: Patient reports significant improvement in abdominal pain today.  No nausea or vomiting.  Objective: BP (!) 154/84 (BP Location: Left Arm)   Pulse (!) 52   Temp 98.4 F (36.9 C) (Oral)   Resp 18   Ht 5\' 9"  (1.753 m)   Wt 114.3 kg   SpO2 97%   BMI 37.21 kg/m   Examination:  General exam: Appears calm and comfortable Respiratory system: Clear to auscultation. Respiratory effort normal. Cardiovascular system: S1 & S2 heard, RRR. Gastrointestinal system: Abdomen is distended, soft and nontender. Normal bowel sounds heard. Central nervous system: Alert and oriented. No focal neurological deficits. Musculoskeletal: Bilateral lower extremity pitting edema. No calf tenderness Psychiatry: Judgement and insight appear normal. Mood & affect appropriate.    Data Reviewed: I have personally reviewed following labs and imaging studies  CBC Lab Results  Component Value Date   WBC 7.5 08/15/2023   RBC 5.25 08/15/2023   HGB 14.8 08/15/2023   HCT 49.4 08/15/2023   MCV 94.1 08/15/2023   MCH 28.2 08/15/2023   PLT 182 08/15/2023   MCHC 30.0 08/15/2023   RDW 13.3 08/15/2023   LYMPHSABS 1.2 07/24/2008   MONOABS 0.4 07/24/2008   EOSABS 0.2 07/24/2008   BASOSABS 0.0 07/24/2008     Last metabolic panel Lab Results  Component Value Date   NA 139 08/15/2023   K 4.2 08/15/2023   CL 107  08/15/2023   CO2 26 08/15/2023   BUN 22 08/15/2023   CREATININE 1.34 (H) 08/15/2023   GLUCOSE 116 (H) 08/15/2023   GFRNONAA 55 (L) 08/15/2023   GFRAA 57 (L) 09/22/2017   CALCIUM  8.7 (L) 08/15/2023   PROT 6.0 (L) 08/15/2023   ALBUMIN 2.8 (L) 08/15/2023   BILITOT 1.2 08/15/2023   ALKPHOS 55 08/15/2023   AST 16  08/15/2023   ALT 17 08/15/2023   ANIONGAP 6 08/15/2023    GFR: Estimated Creatinine Clearance: 57.5 mL/min (A) (by C-G formula based on SCr of 1.34 mg/dL (H)).  No results found for this or any previous visit (from the past 240 hours).    Radiology Studies: CT ABDOMEN PELVIS W CONTRAST Result Date: 08/14/2023 CLINICAL DATA:  Abdominal pain. EXAM: CT ABDOMEN AND PELVIS WITH CONTRAST TECHNIQUE: Multidetector CT imaging of the abdomen and pelvis was performed using the standard protocol following bolus administration of intravenous contrast. RADIATION DOSE REDUCTION: This exam was performed according to the departmental dose-optimization program which includes automated exposure control, adjustment of the mA and/or kV according to patient size and/or use of iterative reconstruction technique. CONTRAST:  OMNIPAQUE  IOHEXOL  300 MG/ML  SOLN COMPARISON:  CT abdomen pelvis dated 08/05/2023. abdominal MRI dated 07/28/2023. FINDINGS: Lower chest: The visualized lung bases are clear. No intra-abdominal free air or free fluid. Hepatobiliary: The liver is unremarkable.  No calcified gallstone. Pancreas: There is diffuse peripancreatic edema in keeping with acute on chronic pancreatitis. Scattered low attenuating lesions throughout the pancreas with mild dilatation of the main pancreatic duct better evaluated on the MRI. Findings may be sequela of chronic pancreatitis with small pseudocyst or IPMN or other mucinous lesions. EUS was recommended on prior MRI. Spleen: Normal in size without focal abnormality. Adrenals/Urinary Tract: The adrenal glands are unremarkable. Mild bilateral renal parenchyma atrophy. Small right renal cysts. There is no hydronephrosis on either side. There is symmetric enhancement and excretion of contrast by both kidneys. The visualized ureters and urinary bladder unremarkable. Stomach/Bowel: There is distal colonic diverticulosis. There is no bowel obstruction or active inflammation. The  appendix is normal. Vascular/Lymphatic: Moderate aortoiliac atherosclerotic disease. The IVC is unremarkable. No portal venous gas. There is no adenopathy. Reproductive: The prostate and seminal vesicles are grossly unremarkable. Other: None Musculoskeletal: Osteopenia with degenerative changes of the spine. No acute osseous pathology. IMPRESSION: 1. Acute on chronic pancreatitis. 2. Scattered low attenuating lesions throughout the pancreas with mild dilatation of the main pancreatic duct better evaluated on the MRI. EUS was recommended on prior MRI. 3. Distal colonic diverticulosis. No bowel obstruction. Normal appendix. 4.  Aortic Atherosclerosis (ICD10-I70.0). Electronically Signed   By: Angus Bark M.D.   On: 08/14/2023 19:52      LOS: 0 days    Aneita Keens, MD [facility] 08/15/2023, 11:55 AM   If 7PM-7AM, please contact night-coverage [email or web address]

## 2023-08-15 NOTE — Hospital Course (Signed)
 Joseph Bates is a 78 y.o. male with a history of COPD chronic pancreatitis, CKD stage IIIa, hyperlipidemia.  Patient presented secondary to pain and nausea and was found to have evidence of acute pancreatitis.  Patient started on bowel rest IV fluids, analgesics for management.

## 2023-08-15 NOTE — H&P (View-Only) (Signed)
 [facility] Consultation Note  Referring Provider: [facility] Primary Care Physician:  Jimmey Mould, MD Primary Gastroenterologist:  [facility]  Reason for Consultation:  abdominal pain  HPI: Joseph Bates is a 78 y.o. male admitted recurrent abdominal pain with recurrent pancreatitis.  Prior similar admission couple weeks ago.  Plans for outpatient EUS, and patient did well post-discharge until yesterday morning when he had recurrent abdominal pain.   Past Medical History:  Diagnosis Date   Arthritis    Asthma    COPD (chronic obstructive pulmonary disease) (HCC)    Dyspnea    only if he does not do his symbicort    GERD (gastroesophageal reflux disease)    occasionaly   History of kidney stones    Hyperlipidemia     Past Surgical History:  Procedure Laterality Date   HERNIA REPAIR     INSERTION OF MESH N/A 09/29/2017   Procedure: INSERTION OF MESH;  Surgeon: Shela Derby, MD;  Location: [facility] OR;  Service: General;  Laterality: N/A;   JOINT REPLACEMENT Bilateral    knee   UMBILICAL HERNIA REPAIR N/A 09/29/2017   Procedure: LAPAROSCOPIC UMBILICAL HERNIA WITH MESH;  Surgeon: Shela Derby, MD;  Location: [facility] OR;  Service: General;  Laterality: N/A;   vocal cord surgery      Prior to Admission medications   Medication Sig Start Date End Date Taking? Authorizing Provider  albuterol  (PROVENTIL  HFA;VENTOLIN  HFA) 108 (90 Base) MCG/ACT inhaler Every 4-6 hours prn. Patient taking differently: Inhale 2 puffs into the lungs as needed for wheezing or shortness of breath. 11/26/15  Yes Diamond Formica, MD  atorvastatin  (LIPITOR) 10 MG tablet Take 10 mg by mouth at bedtime.   Yes [provider]  budesonide -formoterol  (SYMBICORT ) 160-4.5 MCG/ACT inhaler Take 2 puffs first thing in am and then another 2 puffs about 12 hours later. Patient taking differently: Inhale 2 puffs into the lungs 2 (two) times daily. 11/26/15  Yes Diamond Formica, MD  fluticasone   furoate-vilanterol (BREO ELLIPTA ) 200-25 MCG/ACT AEPB Inhale 1 puff into the lungs daily.   Yes [provider]  cephALEXin  (KEFLEX ) 500 MG capsule Take 500 mg by mouth 4 (four) times daily. Patient not taking: Reported on 08/15/2023 07/30/23   [provider]  doxycycline  (VIBRA -TABS) 100 MG tablet Take 100 mg by mouth 2 (two) times daily. Patient not taking: Reported on 08/15/2023 08/01/23   [provider]    Current Facility-Administered Medications  Medication Dose Route Frequency Provider Last Rate Last Admin   albuterol  (PROVENTIL ) (2.5 MG/3ML) 0.083% nebulizer solution 2.5 mg  2.5 mg Nebulization Q4H PRN Verlyn Goad, MD       atorvastatin  (LIPITOR) tablet 10 mg  10 mg Oral QAC supper Verlyn Goad, MD       dextrose  5 % in lactated ringers  infusion   Intravenous Continuous Davida Espy, MD 40 mL/hr at 08/14/23 2245 New Bag at 08/14/23 2245   dextrose  5 % solution   Intravenous Continuous Afton Horse T, DO 40 mL/hr at 08/14/23 2026 New Bag at 08/14/23 2026   enoxaparin  (LOVENOX ) injection 40 mg  40 mg Subcutaneous Q24H Garba, Mohammad L, MD   40 mg at 08/15/23 0908   fluticasone  furoate-vilanterol (BREO ELLIPTA ) 100-25 MCG/ACT 1 puff  1 puff Inhalation Daily Verlyn Goad, MD   1 puff at 08/15/23 1312   HYDROmorphone  (DILAUDID ) injection 0.5 mg  0.5 mg Intravenous Q2H PRN Davida Espy, MD   0.5 mg at 08/14/23  2241   labetalol (NORMODYNE) injection 10 mg  10 mg Intravenous Q10 min PRN Davida Espy, MD       ondansetron  (ZOFRAN ) tablet 4 mg  4 mg Oral Q6H PRN Davida Espy, MD       Or   ondansetron  (ZOFRAN ) injection 4 mg  4 mg Intravenous Q6H PRN Davida Espy, MD       pantoprazole (PROTONIX) injection 40 mg  40 mg Intravenous QHS Davida Espy, MD   40 mg at 08/15/23 0323    Allergies as of 08/14/2023 - Review Complete 08/14/2023  Allergen Reaction Noted   Naproxen sodium Itching     Family History  Problem Relation Age  of Onset   Ovarian cancer Maternal Grandmother     Social History   Socioeconomic History   Marital status: Married    Spouse name: Not on file   Number of children: Not on file   Years of education: Not on file   Highest education level: Not on file  Occupational History   Occupation: PREP ROOM WORKER AT [employer]  Tobacco Use   Smoking status: Former    Current packs/day: 0.00    Average packs/day: 2.0 packs/day for 40.0 years (80.0 ttl pk-yrs)    Types: Cigarettes    Start date: 12/14/1965    Quit date: 12/14/2005    Years since quitting: 17.6   Smokeless tobacco: Never  Vaping Use   Vaping status: Never Used  Substance and Sexual Activity   Alcohol use: Yes    Comment: rarely   Drug use: Never   Sexual activity: Not on file  Other Topics Concern   Not on file  Social History Narrative   Not on file   Social Drivers of Health   Financial Resource Strain: Not on file  Food Insecurity: No Food Insecurity (08/14/2023)   Hunger Vital Sign    Worried About Running Out of Food in the Last Year: Never true    Ran Out of Food in the Last Year: Never true  Transportation Needs: No Transportation Needs (08/14/2023)   PRAPARE - Administrator, Civil Service (Medical): No    Lack of Transportation (Non-Medical): No  Physical Activity: Not on file  Stress: Not on file  Social Connections: Socially Integrated (08/14/2023)   Social Connection and Isolation Panel [NHANES]    Frequency of Communication with Friends and Family: Three times a week    Frequency of Social Gatherings with Friends and Family: Three times a week    Attends Religious Services: More than 4 times per year    Active Member of Clubs or Organizations: Yes    Attends Banker Meetings: More than 4 times per year    Marital Status: Married  Catering manager Violence: Not At Risk (08/14/2023)   Humiliation, Afraid, Rape, and Kick questionnaire    Fear of Current or Ex-Partner: No     Emotionally Abused: No    Physically Abused: No    Sexually Abused: No    Review of Systems: As per HPI, all others negative  Physical Exam: Vital signs in last 24 hours: Temp:  [97.8 F (36.6 C)-98.4 F (36.9 C)] 98.4 F (36.9 C) (06/07 0557) Pulse Rate:  [51-70] 52 (06/07 0557) Resp:  [16-18] 18 (06/07 0557) BP: (145-170)/(80-95) 154/84 (06/07 0557) SpO2:  [91 %-98 %] 97 % (06/07 0557) Weight:  [114.3 kg] 114.3 kg (06/06 1921) Last BM Date : 08/14/23 General:  Alert,  Well-developed, well-nourished, pleasant and cooperative in NAD Head:  Normocephalic and atraumatic. Eyes:  Sclera clear, no icterus.   Conjunctiva pink. Ears:  Normal auditory acuity. Nose:  No deformity, discharge,  or lesions. Mouth:  No deformity or lesions.  Oropharynx pale and dry Neck:  Supple; no masses or thyromegaly. Lungs:  No visible respiratory distress Abdomen:  Soft,protuberant, mild periumbilical and left-sided tenderness with some voluntary guarding; No masses, hepatosplenomegaly or hernias noted. No peritonitis Msk:  Symmetrical without gross deformities. Normal posture. Pulses:  Normal pulses noted. Extremities:  Without clubbing or edema. Neurologic:  Alert and  oriented x4;  grossly normal neurologically. Skin:  Intact without significant lesions or rashes. Psych:  Alert and cooperative. Normal mood and affect.   Lab Results: Recent Labs    08/14/23 1747 08/15/23 0515  WBC 9.9 7.5  HGB 16.1 14.8  HCT 51.0 49.4  PLT 209 182   BMET Recent Labs    08/14/23 1747 08/15/23 0515  NA 141 139  K 4.6 4.2  CL 104 107  CO2 25 26  GLUCOSE 137* 116*  BUN 26* 22  CREATININE 1.41* 1.34*  CALCIUM  10.1 8.7*   LFT Recent Labs    08/15/23 0515  PROT 6.0*  ALBUMIN 2.8*  AST 16  ALT 17  ALKPHOS 55  BILITOT 1.2   PT/INR No results for input(s): "LABPROT", "INR" in the last 72 hours.  Studies/Results: CT ABDOMEN PELVIS W CONTRAST Result Date: 08/14/2023 CLINICAL DATA:   Abdominal pain. EXAM: CT ABDOMEN AND PELVIS WITH CONTRAST TECHNIQUE: Multidetector CT imaging of the abdomen and pelvis was performed using the standard protocol following bolus administration of intravenous contrast. RADIATION DOSE REDUCTION: This exam was performed according to the departmental dose-optimization program which includes automated exposure control, adjustment of the mA and/or kV according to patient size and/or use of iterative reconstruction technique. CONTRAST:  OMNIPAQUE  IOHEXOL  300 MG/ML  SOLN COMPARISON:  CT abdomen pelvis dated 08/05/2023. abdominal MRI dated 07/28/2023. FINDINGS: Lower chest: The visualized lung bases are clear. No intra-abdominal free air or free fluid. Hepatobiliary: The liver is unremarkable.  No calcified gallstone. Pancreas: There is diffuse peripancreatic edema in keeping with acute on chronic pancreatitis. Scattered low attenuating lesions throughout the pancreas with mild dilatation of the main pancreatic duct better evaluated on the MRI. Findings may be sequela of chronic pancreatitis with small pseudocyst or IPMN or other mucinous lesions. EUS was recommended on prior MRI. Spleen: Normal in size without focal abnormality. Adrenals/Urinary Tract: The adrenal glands are unremarkable. Mild bilateral renal parenchyma atrophy. Small right renal cysts. There is no hydronephrosis on either side. There is symmetric enhancement and excretion of contrast by both kidneys. The visualized ureters and urinary bladder unremarkable. Stomach/Bowel: There is distal colonic diverticulosis. There is no bowel obstruction or active inflammation. The appendix is normal. Vascular/Lymphatic: Moderate aortoiliac atherosclerotic disease. The IVC is unremarkable. No portal venous gas. There is no adenopathy. Reproductive: The prostate and seminal vesicles are grossly unremarkable. Other: None Musculoskeletal: Osteopenia with degenerative changes of the spine. No acute osseous pathology.  IMPRESSION: 1. Acute on chronic pancreatitis. 2. Scattered low attenuating lesions throughout the pancreas with mild dilatation of the main pancreatic duct better evaluated on the MRI. EUS was recommended on prior MRI. 3. Distal colonic diverticulosis. No bowel obstruction. Normal appendix. 4.  Aortic Atherosclerosis (ICD10-I70.0). Electronically Signed   By: Angus Bark M.D.   On: 08/14/2023 19:52    Impression:   Recurrent idiopathic acute pancreatitis. Pancreatic  cyst, dilated PD, ? Chronic pancreatitis with pseudocyst.  Plan:   Medical management pancreatitis with IVF, antiemetics, judicious analgesics. Clear liquids for now; would not advance. EUS can be considered middle next week; however the utility, in midst of pancreatitis, will be limited (e.g., we won't be able to aspirate pancreatic cyst given risk for pancreatic fluid leak, and assessment of pancreatic mass much more difficult with higher odds of missing a parenchymal lesion).  However, there is some utility specifically insofar as assessing for choledocholithiasis and gallbladder microlithiasis (prior imaging studies last admission unrevealing). [facility] will revisit Monday.   LOS: 0 days   Natally Ribera M  08/15/2023, 1:32 PM  Cell [phone] If no answer or after 5 PM call [phone]

## 2023-08-16 DIAGNOSIS — K859 Acute pancreatitis without necrosis or infection, unspecified: Secondary | ICD-10-CM | POA: Diagnosis not present

## 2023-08-16 DIAGNOSIS — J449 Chronic obstructive pulmonary disease, unspecified: Secondary | ICD-10-CM | POA: Diagnosis not present

## 2023-08-16 NOTE — Plan of Care (Signed)
   Problem: Nutrition: Goal: Adequate nutrition will be maintained Outcome: Progressing   Problem: Safety: Goal: Ability to remain free from injury will improve Outcome: Progressing   Problem: Skin Integrity: Goal: Risk for impaired skin integrity will decrease Outcome: Progressing

## 2023-08-16 NOTE — Progress Notes (Signed)
 Mobility Specialist - Progress Note   08/16/23 0849  Mobility  Activity Ambulated independently in hallway  Level of Assistance Independent  Assistive Device None  Distance Ambulated (ft) 500 ft  Activity Response Tolerated well  Mobility Referral Yes  Mobility visit 1 Mobility  Mobility Specialist Start Time (ACUTE ONLY) 0839  Mobility Specialist Stop Time (ACUTE ONLY) 0848  Mobility Specialist Time Calculation (min) (ACUTE ONLY) 9 min   Pt received in recliner and agreeable to mobility. No complaints during session. Pt to recliner after session with all needs met.    [facility]

## 2023-08-16 NOTE — Progress Notes (Signed)
 Hey  PROGRESS NOTE    Joseph Bates  QMV:[id number] DOB: [birth year] DOA: 08/14/2023 PCP: Jimmey Mould, MD   Brief Narrative: Joseph Bates is a 78 y.o. male with a history of COPD chronic pancreatitis, CKD stage IIIa, hyperlipidemia.  Patient presented secondary to pain and nausea and was found to have evidence of acute pancreatitis.  Patient started on bowel rest IV fluids, analgesics for management.   Assessment and Plan:  Acute on chronic pancreatitis Unclear etiology of this episode.  Patient with imaging significant for pancreatic lesions associated dilation with prior recommendation for EUS evaluation patient with abdominal pain and associated lipase greater than 2800 on admission.  Patient managed initially with bowel rest and IV fluids in addition to analgesics.  Symptoms have improved today. - Continue clear liquid diet per GI recommendations - Gastroenterology recommendations: Plan for EUS this admission, recommendation to not advance past clear liquid diet  CKD stage IIIa Baseline creatinine appears to be around 1.2-1.4.  Creatinine 1.41 on admission which is not consistent with AKI.  Stable and at baseline.  Primary hypertension Noted.  Patient is not on antihypertensive medication as an outpatient.  Blood pressure is not very well-controlled this admission.  Patient started on labetalol as needed this admission. - Will likely start amlodipine this admission - Continue labetalol  Hyperlipidemia Patient is on Lipitor as an outpatient - Continue Lipitor  COPD Currently not in exacerbation.  Patient is managed on albuterol  and Breo Ellipta  as an outpatient.  Patient is also listed as taking Symbicort , however this is duplicate to Breo Ellipta  and will likely need to be discontinued on discharge. - Continue Breo Ellipta  and albuterol   Obesity, class II Estimated body mass index is 37.21 kg/m as calculated from the following:   Height as of this encounter: 5\' 9"   (1.753 m).   Weight as of this encounter: 114.3 kg.   DVT prophylaxis: Lovenox  Code Status:   Code Status: Full Code Family Communication: Wife at bedside Disposition Plan: Discharge home pending ability to advance diet successfully in addition to gastroenterology recommendations   Consultants:  [facility] gastroenterology  Procedures:  None  Antimicrobials: None   Subjective: Some mild discomfort, but otherwise, feels well. Tolerating his clear liquid diet  Objective: BP (!) 155/81 (BP Location: Left Arm)   Pulse 65   Temp 98.3 F (36.8 C) (Oral)   Resp 16   Ht 5\' 9"  (1.753 m)   Wt 114.3 kg   SpO2 91%   BMI 37.21 kg/m   Examination:  General exam: Appears calm and comfortable Respiratory system: Clear to auscultation. Respiratory effort normal. Cardiovascular system: S1 & S2 heard, RRR. No murmurs. Gastrointestinal system: Abdomen is distended, soft and nontender. Normal bowel sounds heard. Central nervous system: Alert and oriented. No focal neurological deficits. Musculoskeletal: R>L BLE 2+ pitting edema. No calf tenderness Psychiatry: Judgement and insight appear normal. Mood & affect appropriate.    Data Reviewed: I have personally reviewed following labs and imaging studies  CBC Lab Results  Component Value Date   WBC 7.5 08/15/2023   RBC 5.25 08/15/2023   HGB 14.8 08/15/2023   HCT 49.4 08/15/2023   MCV 94.1 08/15/2023   MCH 28.2 08/15/2023   PLT 182 08/15/2023   MCHC 30.0 08/15/2023   RDW 13.3 08/15/2023   LYMPHSABS 1.2 07/24/2008   MONOABS 0.4 07/24/2008   EOSABS 0.2 07/24/2008   BASOSABS 0.0 07/24/2008     Last metabolic panel Lab Results  Component Value Date  NA 139 08/15/2023   K 4.2 08/15/2023   CL 107 08/15/2023   CO2 26 08/15/2023   BUN 22 08/15/2023   CREATININE 1.34 (H) 08/15/2023   GLUCOSE 116 (H) 08/15/2023   GFRNONAA 55 (L) 08/15/2023   GFRAA 57 (L) 09/22/2017   CALCIUM  8.7 (L) 08/15/2023   PROT 6.0 (L) 08/15/2023    ALBUMIN 2.8 (L) 08/15/2023   BILITOT 1.2 08/15/2023   ALKPHOS 55 08/15/2023   AST 16 08/15/2023   ALT 17 08/15/2023   ANIONGAP 6 08/15/2023    GFR: Estimated Creatinine Clearance: 57.5 mL/min (A) (by C-G formula based on SCr of 1.34 mg/dL (H)).  No results found for this or any previous visit (from the past 240 hours).    Radiology Studies: CT ABDOMEN PELVIS W CONTRAST Result Date: 08/14/2023 CLINICAL DATA:  Abdominal pain. EXAM: CT ABDOMEN AND PELVIS WITH CONTRAST TECHNIQUE: Multidetector CT imaging of the abdomen and pelvis was performed using the standard protocol following bolus administration of intravenous contrast. RADIATION DOSE REDUCTION: This exam was performed according to the departmental dose-optimization program which includes automated exposure control, adjustment of the mA and/or kV according to patient size and/or use of iterative reconstruction technique. CONTRAST:  OMNIPAQUE  IOHEXOL  300 MG/ML  SOLN COMPARISON:  CT abdomen pelvis dated 08/05/2023. abdominal MRI dated 07/28/2023. FINDINGS: Lower chest: The visualized lung bases are clear. No intra-abdominal free air or free fluid. Hepatobiliary: The liver is unremarkable.  No calcified gallstone. Pancreas: There is diffuse peripancreatic edema in keeping with acute on chronic pancreatitis. Scattered low attenuating lesions throughout the pancreas with mild dilatation of the main pancreatic duct better evaluated on the MRI. Findings may be sequela of chronic pancreatitis with small pseudocyst or IPMN or other mucinous lesions. EUS was recommended on prior MRI. Spleen: Normal in size without focal abnormality. Adrenals/Urinary Tract: The adrenal glands are unremarkable. Mild bilateral renal parenchyma atrophy. Small right renal cysts. There is no hydronephrosis on either side. There is symmetric enhancement and excretion of contrast by both kidneys. The visualized ureters and urinary bladder unremarkable. Stomach/Bowel: There is  distal colonic diverticulosis. There is no bowel obstruction or active inflammation. The appendix is normal. Vascular/Lymphatic: Moderate aortoiliac atherosclerotic disease. The IVC is unremarkable. No portal venous gas. There is no adenopathy. Reproductive: The prostate and seminal vesicles are grossly unremarkable. Other: None Musculoskeletal: Osteopenia with degenerative changes of the spine. No acute osseous pathology. IMPRESSION: 1. Acute on chronic pancreatitis. 2. Scattered low attenuating lesions throughout the pancreas with mild dilatation of the main pancreatic duct better evaluated on the MRI. EUS was recommended on prior MRI. 3. Distal colonic diverticulosis. No bowel obstruction. Normal appendix. 4.  Aortic Atherosclerosis (ICD10-I70.0). Electronically Signed   By: Angus Bark M.D.   On: 08/14/2023 19:52      LOS: 1 day    Aneita Keens, MD [facility] 08/16/2023, 11:08 AM   If 7PM-7AM, please contact night-coverage [email or web address]

## 2023-08-16 NOTE — Plan of Care (Signed)

## 2023-08-17 DIAGNOSIS — K859 Acute pancreatitis without necrosis or infection, unspecified: Secondary | ICD-10-CM | POA: Diagnosis not present

## 2023-08-17 DIAGNOSIS — J449 Chronic obstructive pulmonary disease, unspecified: Secondary | ICD-10-CM | POA: Diagnosis not present

## 2023-08-17 MED ORDER — PANTOPRAZOLE SODIUM 40 MG PO TBEC
40.0000 mg | DELAYED_RELEASE_TABLET | Freq: Every day | ORAL | Status: DC
  Administered 2023-08-17 – 2023-08-18 (×2): 40 mg via ORAL
  Filled 2023-08-17 (×2): qty 1

## 2023-08-17 NOTE — Progress Notes (Addendum)
 Subjective: Denies abdominal pain. Reports having a bowel movement today. Is ready for his diet to be advanced.  Objective: Vital signs in last 24 hours: Temp:  [97.9 F (36.6 C)-98.4 F (36.9 C)] 98.3 F (36.8 C) (06/09 0635) Pulse Rate:  [56-63] 57 (06/09 0635) Resp:  [14-18] 18 (06/09 0635) BP: (139-147)/(78-83) 147/81 (06/09 0635) SpO2:  [93 %-97 %] 93 % (06/09 0831) Weight change:  Last BM Date : 08/17/23  PE: Overweight GENERAL: Nonicteric  ABDOMEN: Soft, nondistended, nontender EXTREMITIES: No deformity  Lab Results: No results found for this or any previous visit (from the past 48 hours).  Studies/Results: No results found.  Medications: I have reviewed the patient's current medications.  Assessment: Acute on chronic pancreatitis Lipase more than 2800 on admission Abnormal LFTs, T. bili 1.2/AST 16/ALT 17/ALP 55  CT: Diffuse peripancreatic edema, acute on chronic pancreatitis Pancreatic lesions with dilation of main pancreatic duct?  Pseudocyst versus IPMN versus mucinous lesion  MRI 08/05/2023: Extensive ductal dilatation and sidebranch ductal ectasia throughout head, body and tail of pancreas with a larger cystic component and uncinate process of pancreas, chronic pancreatitis with subsequent ductal dilatation versus intraductal mucinous tumor with ductal dilatation No choledocholithiasis or pancreatic divisum  Impaired renal function, BUN 22, creatinine 1.34, GFR 55 Normal triglycerides Colonic diverticulosis  Plan: Will advance diet to low-fat. EUS being planned with Dr. Kimble Pennant on Wednesday at noon. As per Dr. Kimble Pennant it is more to look for stones/sludge in GB/CBD.  He had told patient, he wouldn't aspirate pancreatic cystic lesion given higher risk for post-biopsy pancreatitis and post-biopsy pancreatic leak. Discussed the same with the patient.  Genell Ken, MD 08/17/2023, 10:58 AM

## 2023-08-17 NOTE — Progress Notes (Signed)
   08/17/23 1216  TOC Brief Assessment  Patient has primary care physician Yes  Home environment has been reviewed home with spouse  Prior level of function: independent  Prior/Current Home Services No current home services  Social Drivers of Health Review SDOH reviewed no interventions necessary  Readmission risk has been reviewed Yes  Transition of care needs no transition of care needs at this time

## 2023-08-17 NOTE — Progress Notes (Signed)
 Hey  PROGRESS NOTE    Joseph Bates  NFA:[id number] DOB: 1946/02/16 DOA: 08/14/2023 PCP: Jimmey Mould, MD   Brief Narrative: Joseph Bates is a 78 y.o. male with a history of COPD chronic pancreatitis, CKD stage IIIa, hyperlipidemia.  Patient presented secondary to pain and nausea and was found to have evidence of acute pancreatitis.  Patient started on bowel rest IV fluids, analgesics for management.   Assessment and Plan:  Acute on chronic pancreatitis Unclear etiology of this episode.  Patient with imaging significant for pancreatic lesions associated dilation with prior recommendation for EUS evaluation patient with abdominal pain and associated lipase greater than 2800 on admission.  Patient managed initially with bowel rest and IV fluids in addition to analgesics.  Symptoms have improved today. - Gastroenterology recommendations: Plan for EUS this admission, heart healthy diet  CKD stage IIIa Baseline creatinine appears to be around 1.2-1.4.  Creatinine 1.41 on admission which is not consistent with AKI.  Stable and at baseline.  Primary hypertension Noted.  Patient is not on antihypertensive medication as an outpatient.  Patient started on labetalol as needed this admission. Blood pressure improved slightly off of antihypertensives - Continue labetalol as needed  Hyperlipidemia Patient is on Lipitor as an outpatient - Continue Lipitor  COPD Currently not in exacerbation.  Patient is managed on albuterol  and Breo Ellipta  as an outpatient.  Patient is also listed as taking Symbicort , however this is duplicate to Breo Ellipta  and will likely need to be discontinued on discharge. - Continue Breo Ellipta  and albuterol   Obesity, class II Estimated body mass index is 37.21 kg/m as calculated from the following:   Height as of this encounter: 5\' 9"  (1.753 m).   Weight as of this encounter: 114.3 kg.   DVT prophylaxis: Lovenox  Code Status:   Code Status: Full Code Family  Communication: None at bedside Disposition Plan: Discharge home pending ability to advance diet successfully in addition to gastroenterology recommendations   Consultants:  [facility] gastroenterology  Procedures:  None  Antimicrobials: None   Subjective: No abdominal pain. Some right ankle pain.  Objective: BP (!) 147/81 (BP Location: Left Arm)   Pulse (!) 57   Temp 98.3 F (36.8 C) (Oral)   Resp 18   Ht 5\' 9"  (1.753 m)   Wt 114.3 kg   SpO2 93%   BMI 37.21 kg/m   Examination:  General exam: Appears calm and comfortable Respiratory system: Clear to auscultation. Respiratory effort normal. Cardiovascular system: S1 & S2 heard, RRR. No murmurs. Gastrointestinal system: Abdomen is protuberant, soft and nontender. Normal bowel sounds heard. Central nervous system: Alert and oriented. No focal neurological deficits. Musculoskeletal: R>L BLE 2+ pitting edema. No calf tenderness Psychiatry: Judgement and insight appear normal. Mood & affect appropriate.    Data Reviewed: I have personally reviewed following labs and imaging studies  CBC Lab Results  Component Value Date   WBC 7.5 08/15/2023   RBC 5.25 08/15/2023   HGB 14.8 08/15/2023   HCT 49.4 08/15/2023   MCV 94.1 08/15/2023   MCH 28.2 08/15/2023   PLT 182 08/15/2023   MCHC 30.0 08/15/2023   RDW 13.3 08/15/2023   LYMPHSABS 1.2 07/24/2008   MONOABS 0.4 07/24/2008   EOSABS 0.2 07/24/2008   BASOSABS 0.0 07/24/2008     Last metabolic panel Lab Results  Component Value Date   NA 139 08/15/2023   K 4.2 08/15/2023   CL 107 08/15/2023   CO2 26 08/15/2023   BUN 22 08/15/2023  CREATININE 1.34 (H) 08/15/2023   GLUCOSE 116 (H) 08/15/2023   GFRNONAA 55 (L) 08/15/2023   GFRAA 57 (L) 09/22/2017   CALCIUM  8.7 (L) 08/15/2023   PROT 6.0 (L) 08/15/2023   ALBUMIN 2.8 (L) 08/15/2023   BILITOT 1.2 08/15/2023   ALKPHOS 55 08/15/2023   AST 16 08/15/2023   ALT 17 08/15/2023   ANIONGAP 6 08/15/2023    GFR: Estimated  Creatinine Clearance: 57.5 mL/min (A) (by C-G formula based on SCr of 1.34 mg/dL (H)).  No results found for this or any previous visit (from the past 240 hours).    Radiology Studies: No results found.     LOS: 2 days    Aneita Keens, MD [facility] 08/17/2023, 12:11 PM   If 7PM-7AM, please contact night-coverage [email or web address]

## 2023-08-17 NOTE — Plan of Care (Signed)
   Problem: Education: Goal: Knowledge of General Education information will improve Description: Including pain rating scale, medication(s)/side effects and non-pharmacologic comfort measures Outcome: Progressing   Problem: Activity: Goal: Risk for activity intolerance will decrease Outcome: Progressing

## 2023-08-17 NOTE — Plan of Care (Signed)
   Problem: Clinical Measurements: Goal: Will remain free from infection Outcome: Progressing Goal: Diagnostic test results will improve Outcome: Progressing

## 2023-08-18 DIAGNOSIS — K859 Acute pancreatitis without necrosis or infection, unspecified: Secondary | ICD-10-CM | POA: Diagnosis not present

## 2023-08-18 DIAGNOSIS — J449 Chronic obstructive pulmonary disease, unspecified: Secondary | ICD-10-CM | POA: Diagnosis not present

## 2023-08-18 NOTE — Progress Notes (Signed)
 Hey  Joseph NOTE    Joseph Bates  ZOX:[id number] DOB: 15-Feb-1946 DOA: 08/14/2023 PCP: Jimmey Mould, MD   Brief Narrative: Joseph Bates is a 78 y.o. male with a history of COPD chronic pancreatitis, CKD stage IIIa, hyperlipidemia.  Patient presented secondary to pain and nausea and was found to have evidence of acute pancreatitis.  Patient started on bowel rest IV fluids, analgesics for management.   Assessment and Plan:  Acute on chronic pancreatitis Unclear etiology of this episode.  Patient with imaging significant for pancreatic lesions associated dilation with prior recommendation for EUS evaluation patient with abdominal pain and associated lipase greater than 2800 on admission.  Patient managed initially with bowel rest and IV fluids in addition to analgesics.  Symptoms have improved today. - Gastroenterology recommendations: Plan for EUS this admission (6/11), heart healthy diet  CKD stage IIIa Baseline creatinine appears to be around 1.2-1.4.  Creatinine 1.41 on admission which is not consistent with AKI.  Stable and at baseline.  Primary hypertension Noted.  Patient is not on antihypertensive medication as an outpatient.  Patient started on labetalol as needed this admission. Blood pressure improved slightly off of antihypertensives - Continue labetalol as needed  Hyperlipidemia Patient is on Lipitor as an outpatient - Continue Lipitor  COPD Currently not in exacerbation.  Patient is managed on albuterol  and Breo Ellipta  as an outpatient.  Patient is also listed as taking Symbicort , however this is duplicate to Breo Ellipta  and will likely need to be discontinued on discharge. - Continue Breo Ellipta  and albuterol   Obesity, class II Estimated body mass index is 37.21 kg/m as calculated from the following:   Height as of this encounter: 5\' 9"  (1.753 m).   Weight as of this encounter: 114.3 kg.   DVT prophylaxis: Lovenox  Code Status:   Code Status: Full  Code Family Communication: None at bedside Disposition Plan: Discharge home pending ability to advance diet successfully in addition to gastroenterology recommendations   Consultants:  [facility] gastroenterology  Procedures:  None  Antimicrobials: None   Subjective: No concerns this morning.  Objective: BP (!) 159/85 (BP Location: Left Arm)   Pulse (!) 57   Temp 97.8 F (36.6 C) (Oral)   Resp 18   Ht 5\' 9"  (1.753 m)   Wt 114.3 kg   SpO2 96%   BMI 37.21 kg/m   Examination:  General exam: Appears calm and comfortable Respiratory system: Clear to auscultation. Respiratory effort normal. Cardiovascular system: S1 & S2 heard, RRR. No murmurs. Gastrointestinal system: Abdomen is protuberant, soft and nontender. Normal bowel sounds heard. Central nervous system: Alert and oriented. No focal neurological deficits. Musculoskeletal: R>L BLE 2+ pitting edema. No calf tenderness Psychiatry: Judgement and insight appear normal. Mood & affect appropriate.    Data Reviewed: I have personally reviewed following labs and imaging studies  CBC Lab Results  Component Value Date   WBC 7.5 08/15/2023   RBC 5.25 08/15/2023   HGB 14.8 08/15/2023   HCT 49.4 08/15/2023   MCV 94.1 08/15/2023   MCH 28.2 08/15/2023   PLT 182 08/15/2023   MCHC 30.0 08/15/2023   RDW 13.3 08/15/2023   LYMPHSABS 1.2 07/24/2008   MONOABS 0.4 07/24/2008   EOSABS 0.2 07/24/2008   BASOSABS 0.0 07/24/2008     Last metabolic panel Lab Results  Component Value Date   NA 139 08/15/2023   K 4.2 08/15/2023   CL 107 08/15/2023   CO2 26 08/15/2023   BUN 22 08/15/2023  CREATININE 1.34 (H) 08/15/2023   GLUCOSE 116 (H) 08/15/2023   GFRNONAA 55 (L) 08/15/2023   GFRAA 57 (L) 09/22/2017   CALCIUM  8.7 (L) 08/15/2023   PROT 6.0 (L) 08/15/2023   ALBUMIN 2.8 (L) 08/15/2023   BILITOT 1.2 08/15/2023   ALKPHOS 55 08/15/2023   AST 16 08/15/2023   ALT 17 08/15/2023   ANIONGAP 6 08/15/2023    GFR: Estimated  Creatinine Clearance: 57.5 mL/min (A) (by C-G formula based on SCr of 1.34 mg/dL (H)).  No results found for this or any previous visit (from the past 240 hours).    Radiology Studies: No results found.     LOS: 3 days    Aneita Keens, MD [facility] 08/18/2023, 9:22 AM   If 7PM-7AM, please contact night-coverage [email or web address]

## 2023-08-18 NOTE — Plan of Care (Signed)
   Problem: Education: Goal: Knowledge of General Education information will improve Description: Including pain rating scale, medication(s)/side effects and non-pharmacologic comfort measures Outcome: Progressing   Problem: Activity: Goal: Risk for activity intolerance will decrease Outcome: Progressing

## 2023-08-18 NOTE — Progress Notes (Signed)
 Subjective: Tolerating low-fat diet without associated abdominal pain.  Objective: Vital signs in last 24 hours: Temp:  [97.8 F (36.6 C)-98.3 F (36.8 C)] 97.9 F (36.6 C) (06/10 1338) Pulse Rate:  [57-71] 60 (06/10 1338) Resp:  [16-18] 16 (06/10 1338) BP: (132-159)/(73-85) 132/73 (06/10 1338) SpO2:  [92 %-96 %] 96 % (06/10 1338) Weight change:  Last BM Date : 08/17/23  PE: Overweight GENERAL: Nonicteric, no pallor  ABDOMEN: Nondistended, soft, bowel sounds normoactive EXTREMITIES: No deformity  Lab Results: No results found for this or any previous visit (from the past 48 hours).  Studies/Results: No results found.  Medications: I have reviewed the patient's current medications.  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 Kimble Pennant tomorrow at noon. I have placed orders to hold morning dose of Lovenox  and for the patient to be kept n.p.o. postmidnight.  Genell Ken, MD 08/18/2023, 2:51 PM

## 2023-08-19 DIAGNOSIS — K859 Acute pancreatitis without necrosis or infection, unspecified: Secondary | ICD-10-CM | POA: Diagnosis not present

## 2023-08-19 MED ORDER — PANTOPRAZOLE SODIUM 40 MG PO TBEC: 40.0000 mg | DELAYED_RELEASE_TABLET | Freq: Every day | ORAL | 0 refills | Status: AC

## 2023-08-19 MED ORDER — ONDANSETRON HCL 4 MG PO TABS
4.0000 mg | ORAL_TABLET | Freq: Four times a day (QID) | ORAL | 0 refills | Status: AC | PRN
Start: 2023-08-19 — End: ?

## 2023-08-19 NOTE — Plan of Care (Signed)
  Problem: Activity: Goal: Risk for activity intolerance will decrease Outcome: Progressing   Problem: Coping: Goal: Level of anxiety will decrease Outcome: Progressing   Problem: Elimination: Goal: Will not experience complications related to urinary retention Outcome: Completed/Met

## 2023-08-19 NOTE — Anesthesia Preprocedure Evaluation (Addendum)
 Anesthesia Evaluation  Patient identified by MRN, date of birth, ID band Patient awake    Reviewed: Allergy & Precautions, NPO status , Patient's Chart, lab work & pertinent test results  Airway Mallampati: II  TM Distance: >3 FB Neck ROM: Full    Dental no notable dental hx.    Pulmonary shortness of breath, asthma , COPD,  COPD inhaler, former smoker 80 pack year history    Pulmonary exam normal        Cardiovascular negative cardio ROS Normal cardiovascular exam     Neuro/Psych negative neurological ROS  negative psych ROS   GI/Hepatic Neg liver ROS,GERD  Medicated and Controlled,,Pancreatic cyst   Endo/Other  BMI 37  Renal/GU Renal InsufficiencyRenal diseaseCr 1.34  negative genitourinary   Musculoskeletal  (+) Arthritis , Osteoarthritis,    Abdominal  (+) + obese  Peds  Hematology negative hematology ROS (+) Hb 14.8   Anesthesia Other Findings Pancreatic cyst  Reproductive/Obstetrics negative OB ROS                             Anesthesia Physical Anesthesia Plan  ASA: 3  Anesthesia Plan: MAC   Post-op Pain Management:    Induction:   PONV Risk Score and Plan: 2 and Propofol  infusion  Airway Management Planned: Nasal Cannula  Additional Equipment:   Intra-op Plan:   Post-operative Plan:   Informed Consent: I have reviewed the patients History and Physical, chart, labs and discussed the procedure including the risks, benefits and alternatives for the proposed anesthesia with the patient or authorized representative who has indicated his/her understanding and acceptance.       Plan Discussed with: CRNA  Anesthesia Plan Comments:        Anesthesia Quick Evaluation

## 2023-08-19 NOTE — Progress Notes (Signed)
 Patient originally scheduled for EUS with Dr. Kimble Pennant today at noon.  However due to scheduling conflict, procedure needs to be rescheduled.  I discussed the same with the patient.  Dr. Audry Leavell medical assistant will reach the patient to reschedule EUS to be done as an outpatient.  Okay to DC home from GI standpoint.

## 2023-08-19 NOTE — Progress Notes (Signed)
 Discharge instructions given to patient and all questions were answered.

## 2023-08-19 NOTE — Plan of Care (Signed)
   Problem: Education: Goal: Knowledge of General Education information will improve Description: Including pain rating scale, medication(s)/side effects and non-pharmacologic comfort measures Outcome: Progressing   Problem: Activity: Goal: Risk for activity intolerance will decrease Outcome: Progressing

## 2023-08-19 NOTE — Discharge Summary (Signed)
 Physician Discharge Summary  Joseph Bates ZOX:[id number] DOB: October 10, 1945 DOA: 08/14/2023  PCP: Jimmey Mould, MD  Admit date: 08/14/2023 Discharge date: 08/19/2023  Admitted From: Home Disposition: Home  Recommendations for Outpatient Follow-up:  Follow up with PCP in 1 week with repeat CBC/CMP Outpatient follow-up with GI.  GI to arrange for outpatient EUS Follow up in ED if symptoms worsen or new appear   Home Health: No Equipment/Devices: None  Discharge Condition: Stable CODE STATUS: Full Diet recommendation: Heart healthy  Brief/Interim Summary: 78 y.o. male with a history of COPD, chronic pancreatitis, CKD stage IIIa, hyperlipidemia presented with worsening abdominal pain and was found to have acute on chronic pancreatitis.  He was managed conservatively with IV fluids, analgesics, antiemetics as needed.  GI was consulted.  During the hospitalization, his condition has improved.  GI was initially planning for EUS for today but this could not be performed due to scheduling conflict.  GI will arrange for outpatient EUS and has cleared the patient for discharge.  He will be discharged home today with outpatient follow-up with PCP and GI.  Discharge Diagnoses:   Acute on chronic pancreatitis - Unclear etiology.  Managed conservatively with IV fluids/antiemetics/analgesics as needed.  Diet has subsequently been advanced and patient is tolerating it.  Outpatient follow-up with GI  Pancreatic lesions associated with pancreatic duct dilatation -GI was initially planning for EUS for today but this could not be performed due to scheduling conflict.  GI will arrange for outpatient EUS and has cleared the patient for discharge.  He will be discharged home today with outpatient follow-up with PCP and GI.  Essential hypertension--blood pressure stable.  Not on any antihypertensives as an outpatient.  Outpatient follow-up with PCP  Hyperlipidemia - Continue Lipitor  COPD - Stable.   Continue home inhaled regimen  Obesity class II - Outpatient follow-up  CKD stage IIIa - Creatinine stable.  Outpatient follow-up   Discharge Instructions  Discharge Instructions     Diet - low sodium heart healthy   Complete by: As directed    Increase activity slowly   Complete by: As directed       Allergies as of 08/19/2023       Reactions   Naproxen Sodium Itching        Medication List     STOP taking these medications    Breo Ellipta  200-25 MCG/ACT Aepb Generic drug: fluticasone  furoate-vilanterol   cephALEXin  500 MG capsule Commonly known as: KEFLEX    doxycycline  100 MG tablet Commonly known as: VIBRA -TABS       TAKE these medications    albuterol  108 (90 Base) MCG/ACT inhaler Commonly known as: VENTOLIN  HFA Every 4-6 hours prn. What changed:  how much to take how to take this when to take this reasons to take this additional instructions   atorvastatin  10 MG tablet Commonly known as: LIPITOR Take 10 mg by mouth at bedtime.   budesonide -formoterol  160-4.5 MCG/ACT inhaler Commonly known as: SYMBICORT  Take 2 puffs first thing in am and then another 2 puffs about 12 hours later. What changed:  how much to take how to take this when to take this additional instructions   ondansetron  4 MG tablet Commonly known as: ZOFRAN  Take 1 tablet (4 mg total) by mouth every 6 (six) hours as needed for nausea.   pantoprazole 40 MG tablet Commonly known as: PROTONIX Take 1 tablet (40 mg total) by mouth at bedtime.        Follow-up Information  Jimmey Mould, MD. Schedule an appointment as soon as possible for a visit in 1 week(s).   Specialty: Family Medicine Contact information: [address] [place] [address] [phone]         Evangeline Hilts, MD. Schedule an appointment as soon as possible for a visit in 1 week(s).   Specialty: Gastroenterology Contact information: [address] [place]  [address] [phone]                Allergies  Allergen Reactions   Naproxen Sodium Itching    Consultations: GI   Procedures/Studies: CT ABDOMEN PELVIS W CONTRAST Result Date: 08/14/2023 CLINICAL DATA:  Abdominal pain. EXAM: CT ABDOMEN AND PELVIS WITH CONTRAST TECHNIQUE: Multidetector CT imaging of the abdomen and pelvis was performed using the standard protocol following bolus administration of intravenous contrast. RADIATION DOSE REDUCTION: This exam was performed according to the departmental dose-optimization program which includes automated exposure control, adjustment of the mA and/or kV according to patient size and/or use of iterative reconstruction technique. CONTRAST:  OMNIPAQUE  IOHEXOL  300 MG/ML  SOLN COMPARISON:  CT abdomen pelvis dated 08/05/2023. abdominal MRI dated 07/28/2023. FINDINGS: Lower chest: The visualized lung bases are clear. No intra-abdominal free air or free fluid. Hepatobiliary: The liver is unremarkable.  No calcified gallstone. Pancreas: There is diffuse peripancreatic edema in keeping with acute on chronic pancreatitis. Scattered low attenuating lesions throughout the pancreas with mild dilatation of the main pancreatic duct better evaluated on the MRI. Findings may be sequela of chronic pancreatitis with small pseudocyst or IPMN or other mucinous lesions. EUS was recommended on prior MRI. Spleen: Normal in size without focal abnormality. Adrenals/Urinary Tract: The adrenal glands are unremarkable. Mild bilateral renal parenchyma atrophy. Small right renal cysts. There is no hydronephrosis on either side. There is symmetric enhancement and excretion of contrast by both kidneys. The visualized ureters and urinary bladder unremarkable. Stomach/Bowel: There is distal colonic diverticulosis. There is no bowel obstruction or active inflammation. The appendix is normal. Vascular/Lymphatic: Moderate aortoiliac atherosclerotic disease. The IVC is unremarkable. No  portal venous gas. There is no adenopathy. Reproductive: The prostate and seminal vesicles are grossly unremarkable. Other: None Musculoskeletal: Osteopenia with degenerative changes of the spine. No acute osseous pathology. IMPRESSION: 1. Acute on chronic pancreatitis. 2. Scattered low attenuating lesions throughout the pancreas with mild dilatation of the main pancreatic duct better evaluated on the MRI. EUS was recommended on prior MRI. 3. Distal colonic diverticulosis. No bowel obstruction. Normal appendix. 4.  Aortic Atherosclerosis (ICD10-I70.0). Electronically Signed   By: Angus Bark M.D.   On: 08/14/2023 19:52   US  Abdomen Limited RUQ (LIVER/GB) Result Date: 08/07/2023 CLINICAL DATA:  [id number] Pancreatitis [id number] EXAM: ULTRASOUND ABDOMEN LIMITED RIGHT UPPER QUADRANT COMPARISON:  May 28, 25 FINDINGS: Evaluation is limited by shadowing bowel gas. Gallbladder: No gallstones or wall thickening visualized. No sonographic Murphy sign noted by sonographer. Common bile duct: Diameter: Visualized portion measures 5 mm, within normal limits. Liver: No focal lesion identified. Previously described hepatic cysts is not visualized sonographically. Within normal limits in parenchymal echogenicity. Portal vein is patent on color Doppler imaging with normal direction of blood flow towards the liver. Other: None. IMPRESSION: No cholelithiasis visualized. Please see separately dictated MRI report regarding pancreatic findings. Electronically Signed   By: Clancy Crimes M.D.   On: 08/07/2023 08:12   MR ABDOMEN W WO CONTRAST Result Date: 08/05/2023 CLINICAL DATA:  Pancreatitis identified on CT. Cystic lesions in the body the pancreas. EXAM:  MRI ABDOMEN WITHOUT AND WITH CONTRAST TECHNIQUE: Multiplanar multisequence MR imaging of the abdomen was performed both before and after the administration of intravenous contrast. CONTRAST:  10mL GADAVIST  GADOBUTROL  1 MMOL/ML IV SOLN COMPARISON:  CT 07/28/2023 FINDINGS: Some  motion degradation of imaging related to breathing. Lower chest:  Lung bases are clear. Hepatobiliary: Normal hepatic parenchyma. Small benign cyst in LEFT hepatic lobe. No intrahepatic scratch the no intrahepatic biliary duct dilatation. Gallbladder normal. The common bile duct is normal caliber without duct dilatation, choledocholithiasis, or obstruction. Pancreas: No dilatation of the pancreatic duct through the body and tail the pancreas with side branch ductal ectasia. There is a cystic lesion in the uncinate the pancreas measuring approximately 3.3 cm (image 27/4. Extensive ductal ectasia and mild duct dilatation noted on image 20/series 4 of fat-suppressed T2 weighted imaging. No discrete obstructing lesion identified within the pancreatic head. Ductal dilatation extends to the ampullary region. No clear evidence of pancreas divisum anatomy. There is edema associated head of the pancreas small amount of fluid along the junction of the pancreas and second portion duodenum. No organized fluid collections are present. Spleen: Normal spleen. Adrenals/urinary tract: Adrenal glands normal. Bilateral simple renal cysts without enhancement. Stomach/Bowel: Small hiatal hernia. Limited view of the bowel is unremarkable. Vascular/Lymphatic: Abdominal aortic normal caliber. No retroperitoneal periportal lymphadenopathy. Musculoskeletal: No aggressive osseous lesion IMPRESSION: 1. Extensive duct dilatation and side branch ductal ectasia throughout the head, body and tail the pancreas with a larger cystic component in the uncinate of the pancreas. Findings suggest chronic pancreatitis with subsequent duct dilatation versus intraductal mucinous tumor with duct dilatation. 2. No obstructing lesion in the head of the pancreas to suggest adenocarcinoma. 3. No choledocholithiasis.  No ductus divisum.  No cholelithiasis. 4. Consider EUS with FNA to evaluate for intraductal mucinous tumor. 5. No intra or extra hepatic biliary  duct dilatation. 6. Hiatal hernia. Electronically Signed   By: Deboraha Fallow M.D.   On: 08/05/2023 15:49   MR 3D Recon At Scanner Result Date: 08/05/2023 CLINICAL DATA:  Pancreatitis identified on CT. Cystic lesions in the body the pancreas. EXAM: MRI ABDOMEN WITHOUT AND WITH CONTRAST TECHNIQUE: Multiplanar multisequence MR imaging of the abdomen was performed both before and after the administration of intravenous contrast. CONTRAST:  10mL GADAVIST  GADOBUTROL  1 MMOL/ML IV SOLN COMPARISON:  CT 07/28/2023 FINDINGS: Some motion degradation of imaging related to breathing. Lower chest:  Lung bases are clear. Hepatobiliary: Normal hepatic parenchyma. Small benign cyst in LEFT hepatic lobe. No intrahepatic scratch the no intrahepatic biliary duct dilatation. Gallbladder normal. The common bile duct is normal caliber without duct dilatation, choledocholithiasis, or obstruction. Pancreas: No dilatation of the pancreatic duct through the body and tail the pancreas with side branch ductal ectasia. There is a cystic lesion in the uncinate the pancreas measuring approximately 3.3 cm (image 27/4. Extensive ductal ectasia and mild duct dilatation noted on image 20/series 4 of fat-suppressed T2 weighted imaging. No discrete obstructing lesion identified within the pancreatic head. Ductal dilatation extends to the ampullary region. No clear evidence of pancreas divisum anatomy. There is edema associated head of the pancreas small amount of fluid along the junction of the pancreas and second portion duodenum. No organized fluid collections are present. Spleen: Normal spleen. Adrenals/urinary tract: Adrenal glands normal. Bilateral simple renal cysts without enhancement. Stomach/Bowel: Small hiatal hernia. Limited view of the bowel is unremarkable. Vascular/Lymphatic: Abdominal aortic normal caliber. No retroperitoneal periportal lymphadenopathy. Musculoskeletal: No aggressive osseous lesion IMPRESSION: 1. Extensive duct  dilatation and  side branch ductal ectasia throughout the head, body and tail the pancreas with a larger cystic component in the uncinate of the pancreas. Findings suggest chronic pancreatitis with subsequent duct dilatation versus intraductal mucinous tumor with duct dilatation. 2. No obstructing lesion in the head of the pancreas to suggest adenocarcinoma. 3. No choledocholithiasis.  No ductus divisum.  No cholelithiasis. 4. Consider EUS with FNA to evaluate for intraductal mucinous tumor. 5. No intra or extra hepatic biliary duct dilatation. 6. Hiatal hernia. Electronically Signed   By: Deboraha Fallow M.D.   On: 08/05/2023 15:49   CT ABDOMEN PELVIS W CONTRAST Result Date: 08/05/2023 CLINICAL DATA:  Abdominal pain. EXAM: CT ABDOMEN AND PELVIS WITH CONTRAST TECHNIQUE: Multidetector CT imaging of the abdomen and pelvis was performed using the standard protocol following bolus administration of intravenous contrast. RADIATION DOSE REDUCTION: This exam was performed according to the departmental dose-optimization program which includes automated exposure control, adjustment of the mA and/or kV according to patient size and/or use of iterative reconstruction technique. CONTRAST:  OMNIPAQUE  IOHEXOL  300 MG/ML  SOLN COMPARISON:  None Available. FINDINGS: Lower chest: 5 mm left lower lobe pulmonary nodule identified on 07/03. Hepatobiliary: A tiny hypodensity in the liver parenchyma is too small to characterize but is statistically most likely benign. No followup imaging is recommended. There is no evidence for gallstones, gallbladder wall thickening, or pericholecystic fluid. No intrahepatic or extrahepatic biliary dilation. Pancreas: Numerous low-density/cystic lesions are seen scattered through the pancreatic parenchyma including 2.2 cm lesion in the posterior pancreatic head, 1.8 cm lesion in the body of the pancreas and 1.9 cm lesion in the tail the pancreas. There is subtle peripancreatic edema in the  region of the pancreatic head and uncinate process (see image 43/2). Mild diffuse prominence of the main pancreatic duct. Spleen: Tiny hypodensity in the spleen cannot be definitively characterized but is likely benign. Adrenals/Urinary Tract: No adrenal nodule or mass. Tiny well-defined homogeneous low-density lesions in both kidneys are too small to characterize but are statistically most likely benign and probably cysts. No followup imaging is recommended. No evidence for hydroureter. The urinary bladder appears normal for the degree of distention. Stomach/Bowel: Small hiatal hernia. Stomach otherwise unremarkable. Duodenum is normally positioned as is the ligament of Treitz. Wall of the distal descending and proximal transverse duodenum is ill-defined in the region of the above described edema. No small bowel wall thickening. No small bowel dilatation. The terminal ileum is normal. The appendix is normal. No gross colonic mass. No colonic wall thickening. Diverticular changes are noted in the left colon without evidence of diverticulitis. Vascular/Lymphatic: There is moderate atherosclerotic calcification of the abdominal aorta without aneurysm. There is no gastrohepatic or hepatoduodenal ligament lymphadenopathy. No retroperitoneal or mesenteric lymphadenopathy. No pelvic sidewall lymphadenopathy. Reproductive: The prostate gland and seminal vesicles are unremarkable. Other: No intraperitoneal free fluid. Musculoskeletal: No worrisome lytic or sclerotic osseous abnormality. IMPRESSION: 1. Subtle peripancreatic edema in the region of the pancreatic head and uncinate process. Imaging features are compatible with acute pancreatitis. 2. Numerous low-density/cystic lesions scattered through the pancreatic parenchyma including 2.2 cm lesion in the posterior pancreatic head, 1.8 cm lesion in the body of the pancreas and 1.9 cm lesion in the tail the pancreas. Imaging features may reflect pseudocysts or side branch  IPMNs. Follow-up MRI abdomen with and without contrast recommended. 3. Wall of the distal descending and proximal transverse duodenum is ill-defined in the region of the above described peripancreatic edema. This may be reactive to the above described  pancreatitis. Duodenitis could also have this appearance. 4. Small hiatal hernia. 5. Left colonic diverticulosis without diverticulitis. 6. 5 mm left lower lobe pulmonary nodule. No follow-up needed if patient is low-risk.This recommendation follows the consensus statement: Guidelines for Management of Incidental Pulmonary Nodules Detected on CT Images: From the Fleischner Society 2017; Radiology 2017; 284:228-243. 7.  Aortic Atherosclerosis (ICD10-I70.0). Electronically Signed   By: Donnal Fusi M.D.   On: 08/05/2023 06:32      Subjective: Patient seen and examined at bedside.  Denies worsening abdominal pain.  No fever or vomiting reported.  Discharge Exam: Vitals:   08/19/23 0851 08/19/23 0853  BP:    Pulse:    Resp:    Temp:    SpO2: 98% 98%    General: Pt is alert, awake, not in acute distress.  On room air. Cardiovascular: rate controlled, S1/S2 + Respiratory: bilateral decreased breath sounds at bases Abdominal: Soft, obese, NT, ND, bowel sounds + Extremities: no edema, no cyanosis    The results of significant diagnostics from this hospitalization (including imaging, microbiology, ancillary and laboratory) are listed below for reference.     Microbiology: No results found for this or any previous visit (from the past 240 hours).   Labs: BNP (last 3 results) No results for input(s): BNP in the last 8760 hours. Basic Metabolic Panel: Recent Labs  Lab 08/14/23 1747 08/15/23 0515  NA 141 139  K 4.6 4.2  CL 104 107  CO2 25 26  GLUCOSE 137* 116*  BUN 26* 22  CREATININE 1.41* 1.34*  CALCIUM  10.1 8.7*   Liver Function Tests: Recent Labs  Lab 08/14/23 1747 08/15/23 0515  AST 22 16  ALT 19 17  ALKPHOS 77 55   BILITOT 1.0 1.2  PROT 7.5 6.0*  ALBUMIN 3.7 2.8*   Recent Labs  Lab 08/14/23 1747  LIPASE >2,800*   No results for input(s): AMMONIA in the last 168 hours. CBC: Recent Labs  Lab 08/14/23 1747 08/15/23 0515  WBC 9.9 7.5  HGB 16.1 14.8  HCT 51.0 49.4  MCV 89.0 94.1  PLT 209 182   Cardiac Enzymes: No results for input(s): CKTOTAL, CKMB, CKMBINDEX, TROPONINI in the last 168 hours. BNP: Invalid input(s): POCBNP CBG: No results for input(s): GLUCAP in the last 168 hours. D-Dimer No results for input(s): DDIMER in the last 72 hours. Hgb A1c No results for input(s): HGBA1C in the last 72 hours. Lipid Profile No results for input(s): CHOL, HDL, LDLCALC, TRIG, CHOLHDL, LDLDIRECT in the last 72 hours. Thyroid  function studies No results for input(s): TSH, T4TOTAL, T3FREE, THYROIDAB in the last 72 hours.  Invalid input(s): FREET3 Anemia work up No results for input(s): VITAMINB12, FOLATE, FERRITIN, TIBC, IRON, RETICCTPCT in the last 72 hours. Urinalysis    Component Value Date/Time   COLORURINE YELLOW 08/14/2023 1747   APPEARANCEUR CLEAR 08/14/2023 1747   LABSPEC 1.021 08/14/2023 1747   PHURINE 7.0 08/14/2023 1747   GLUCOSEU NEGATIVE 08/14/2023 1747   HGBUR NEGATIVE 08/14/2023 1747   BILIRUBINUR NEGATIVE 08/14/2023 1747   KETONESUR NEGATIVE 08/14/2023 1747   PROTEINUR TRACE (A) 08/14/2023 1747   UROBILINOGEN 0.2 07/24/2008 1610   NITRITE NEGATIVE 08/14/2023 1747   LEUKOCYTESUR NEGATIVE 08/14/2023 1747   Sepsis Labs Recent Labs  Lab 08/14/23 1747 08/15/23 0515  WBC 9.9 7.5   Microbiology No results found for this or any previous visit (from the past 240 hours).   Time coordinating discharge: 35 minutes  SIGNED:   Audria Leather, MD  [facility]  08/19/2023, 10:52 AM

## 2023-08-24 DIAGNOSIS — Z6837 Body mass index (BMI) 37.0-37.9, adult: Secondary | ICD-10-CM | POA: Diagnosis not present

## 2023-08-24 DIAGNOSIS — K859 Acute pancreatitis without necrosis or infection, unspecified: Secondary | ICD-10-CM | POA: Diagnosis not present

## 2023-08-24 DIAGNOSIS — L989 Disorder of the skin and subcutaneous tissue, unspecified: Secondary | ICD-10-CM | POA: Diagnosis not present

## 2023-08-24 DIAGNOSIS — R609 Edema, unspecified: Secondary | ICD-10-CM | POA: Diagnosis not present

## 2023-08-24 DIAGNOSIS — N1831 Chronic kidney disease, stage 3a: Secondary | ICD-10-CM | POA: Diagnosis not present

## 2023-08-24 DIAGNOSIS — Z09 Encounter for follow-up examination after completed treatment for conditions other than malignant neoplasm: Secondary | ICD-10-CM | POA: Diagnosis not present

## 2023-08-24 DIAGNOSIS — R899 Unspecified abnormal finding in specimens from other organs, systems and tissues: Secondary | ICD-10-CM | POA: Diagnosis not present

## 2023-08-25 ENCOUNTER — Other Ambulatory Visit: Payer: Self-pay

## 2023-08-25 ENCOUNTER — Encounter (HOSPITAL_COMMUNITY)
Admission: RE | Disposition: A | Discharge status: Home / Self Care | Payer: Self-pay | Source: Home / Self Care | Attending: Gastroenterology

## 2023-08-25 ENCOUNTER — Encounter (HOSPITAL_COMMUNITY): Payer: Self-pay | Admitting: Gastroenterology

## 2023-08-25 ENCOUNTER — Ambulatory Visit (HOSPITAL_COMMUNITY)
Admission: RE | Admit: 2023-08-25 | Discharge: 2023-08-25 | Disposition: A | Discharge status: Home / Self Care | Attending: Gastroenterology | Admitting: Gastroenterology

## 2023-08-25 ENCOUNTER — Ambulatory Visit (HOSPITAL_COMMUNITY): Payer: Self-pay | Admitting: Anesthesiology

## 2023-08-25 DIAGNOSIS — R933 Abnormal findings on diagnostic imaging of other parts of digestive tract: Secondary | ICD-10-CM | POA: Diagnosis not present

## 2023-08-25 DIAGNOSIS — Z87891 Personal history of nicotine dependence: Secondary | ICD-10-CM | POA: Diagnosis not present

## 2023-08-25 DIAGNOSIS — K7689 Other specified diseases of liver: Secondary | ICD-10-CM | POA: Insufficient documentation

## 2023-08-25 DIAGNOSIS — K861 Other chronic pancreatitis: Secondary | ICD-10-CM | POA: Diagnosis not present

## 2023-08-25 DIAGNOSIS — Z7951 Long term (current) use of inhaled steroids: Secondary | ICD-10-CM | POA: Insufficient documentation

## 2023-08-25 DIAGNOSIS — K862 Cyst of pancreas: Secondary | ICD-10-CM | POA: Diagnosis not present

## 2023-08-25 DIAGNOSIS — J449 Chronic obstructive pulmonary disease, unspecified: Secondary | ICD-10-CM | POA: Diagnosis not present

## 2023-08-25 DIAGNOSIS — K219 Gastro-esophageal reflux disease without esophagitis: Secondary | ICD-10-CM | POA: Diagnosis not present

## 2023-08-25 DIAGNOSIS — K828 Other specified diseases of gallbladder: Secondary | ICD-10-CM | POA: Insufficient documentation

## 2023-08-25 DIAGNOSIS — K859 Acute pancreatitis without necrosis or infection, unspecified: Secondary | ICD-10-CM | POA: Diagnosis not present

## 2023-08-25 DIAGNOSIS — Z6837 Body mass index (BMI) 37.0-37.9, adult: Secondary | ICD-10-CM | POA: Diagnosis not present

## 2023-08-25 DIAGNOSIS — R109 Unspecified abdominal pain: Secondary | ICD-10-CM | POA: Diagnosis not present

## 2023-08-25 HISTORY — PX: ESOPHAGOGASTRODUODENOSCOPY: SHX5428

## 2023-08-25 HISTORY — PX: EUS: SHX5427

## 2023-08-25 SURGERY — ULTRASOUND, UPPER GI TRACT, ENDOSCOPIC
Anesthesia: Monitor Anesthesia Care

## 2023-08-25 MED ORDER — LIDOCAINE HCL 1 % IJ SOLN
INTRAMUSCULAR | Status: DC | PRN
  Administered 2023-08-25: 100 mg via INTRADERMAL

## 2023-08-25 MED ORDER — PROPOFOL 500 MG/50ML IV EMUL
INTRAVENOUS | Status: DC | PRN
  Administered 2023-08-25: 150 ug/kg/min via INTRAVENOUS

## 2023-08-25 MED ORDER — PROPOFOL 10 MG/ML IV BOLUS
INTRAVENOUS | Status: DC | PRN
  Administered 2023-08-25: 20 mg via INTRAVENOUS
  Administered 2023-08-25: 10 mg via INTRAVENOUS
  Administered 2023-08-25: 20 mg via INTRAVENOUS

## 2023-08-25 MED ORDER — PROPOFOL 500 MG/50ML IV EMUL
INTRAVENOUS | Status: AC
Start: 2023-08-25 — End: 2023-08-25
  Filled 2023-08-25: qty 50

## 2023-08-25 MED ORDER — SODIUM CHLORIDE 0.9 % IV SOLN: INTRAVENOUS | Status: DC

## 2023-08-25 NOTE — Interval H&P Note (Signed)
 History and Physical Interval Note:  08/25/2023 10:45 AM  Joseph Bates  has presented today for surgery, with the diagnosis of Pancreatic cyst.  The various methods of treatment have been discussed with the patient and family. After consideration of risks, benefits and other options for treatment, the patient has consented to  Procedure(s): ULTRASOUND, UPPER GI TRACT, ENDOSCOPIC (N/A) as a surgical intervention.  The patient's history has been reviewed, patient examined, no change in status, stable for surgery.  I have reviewed the patient's chart and labs.  Questions were answered to the patient's satisfaction.     Yves Herb

## 2023-08-25 NOTE — Op Note (Signed)
 [facility] Patient Name: Joseph Bates Procedure Date: 08/25/2023 MRN: [id number] Attending MD: Evangeline Hilts , MD, [id number] Date of Birth: [birth year] CSN: [id number] Age: 78 Admit Type: Inpatient Procedure:                Upper EUS Indications:              Pancreatic cyst on MRCP, Suspected chronic                            pancreatitis Providers:                Evangeline Hilts, MD, Golda Latch, RN, Rinda Cheers, Technician Referring MD:              Medicines:                Monitored Anesthesia Care Complications:            No immediate complications. Estimated Blood Loss:     Estimated blood loss: none. Procedure:                Pre-Anesthesia Assessment:                           - Prior to the procedure, a History and Physical                            was performed, and patient medications and                            allergies were reviewed. The patient's tolerance of                            previous anesthesia was also reviewed. The risks                            and benefits of the procedure and the sedation                            options and risks were discussed with the patient.                            All questions were answered, and informed consent                            was obtained. Prior Anticoagulants: The patient has                            taken no anticoagulant or antiplatelet agents. ASA                            Grade Assessment: III - A patient with severe                            systemic disease. After  reviewing the risks and                            benefits, the patient was deemed in satisfactory                            condition to undergo the procedure.                           After obtaining informed consent, the endoscope was                            passed under direct vision. Throughout the                            procedure, the patient's blood pressure, pulse,  and                            oxygen saturations were monitored continuously. The                            [id number] ([id number]) Olympus linear ultrasound scope                            was introduced through the mouth, and advanced to                            the second part of duodenum. The upper EUS was                            accomplished without difficulty. The patient                            tolerated the procedure well. Scope In: Scope Out: Findings:      ENDOSONOGRAPHIC FINDING: :      Fish-mouth appearance of ampulla.      A cyst was found in the left lobe of the liver and measured 10 mm by 10       mm in maximal cross-sectional diameter. The cyst was anechoic. It was       thinly septated. The outer wall of the lesion was thin.      A few benign-appearing lymph nodes were visualized in the peripancreatic       region. The nodes were hypoechoic.      The pancreatic duct had a beaded endosonographic appearance, had a       dilated endosonographic appearance and had a prominently branched       endosonographic appearance in the pancreatic head, uncinate process of       the pancreas, body of the pancreas and tail of the pancreas. The       pancreatic duct measured up to 6 mm in diameter.      An anechoic lesion suggestive of a cyst was identified in the uncinate       process of the pancreas. It communicates with the pancreatic duct. The       lesion measured 20 mm by 20 mm in maximal  cross-sectional diameter.       There were a few compartments thinly septated. The outer wall of the       lesion was thin. There was no associated mass. There was internal debris       within the fluid-filled cavity.      There was some smoldering ultrasonographic remnants of pancreatitis       including hyperechoic strands/foci especially in head/uncinate pancreas,       as well as symmetrical thickening of bile duct and some benign       reactive-appearing adenopathy. No pancreatic  mass was seen, but there       are limitations in excluding pancreatic lesions (especially small ones)       in a field of pancreatic inflammation.      A polyp was identified endosonographically in the gallbladder.      There was no sign of significant endosonographic abnormality in the       common bile duct. The maximum diameter of the duct was 4 mm. Impression:               - A cyst was found in the left lobe of the liver                            and measured 10 mm by 10 mm.                           - A few benign lymph nodes were visualized in the                            peripancreatic region.                           - The pancreatic duct had a beaded endosonographic                            appearance, had a dilated endosonographic                            appearance and had a prominently branched                            endosonographic appearance in the pancreatic head,                            uncinate process of the pancreas, body of the                            pancreas and tail of the pancreas. The pancreatic                            duct measured up to 6 mm in diameter.                           - A cystic lesion was seen in the uncinate process  of the pancreas.                           - A polyp was found in the gallbladder.                           - There was no sign of significant pathology in the                            common bile duct.                           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 Main-duct IPMN can, in and of itself,                            cause recurrent pancreatitis. Moderate Sedation:      None Recommendation:           - Discharge patient to home (via wheelchair).                           - Low fat diet indefinitely.                           - Continue present  medications.                           - Refer to a pancreatic surgeon at appointment to                            be scheduled. The only effective treatment for this                            condition is surgical resection, risks of which                            need to be weighed against benefits. Procedure Code(s):        --- Professional ---                           [phone], Esophagogastroduodenoscopy, flexible,                            transoral; with endoscopic ultrasound examination                            limited to the esophagus, stomach or duodenum, and                            adjacent structures Diagnosis Code(s):        ---  Professional ---                           K76.89, Other specified diseases of liver                           I89.9, Noninfective disorder of lymphatic vessels                            and lymph nodes, unspecified                           K86.2, Cyst of pancreas                           K82.8, Other specified diseases of gallbladder                           R93.3, Abnormal findings on diagnostic imaging of                            other parts of digestive tract CPT copyright 2022 [employer]. All rights reserved. The codes documented in this report are preliminary and upon coder review may  be revised to meet current compliance requirements. Evangeline Hilts, MD 08/25/2023 11:30:58 AM This report has been signed electronically. Number of Addenda: 0

## 2023-08-25 NOTE — Discharge Instructions (Signed)

## 2023-08-25 NOTE — Transfer of Care (Signed)
 Immediate Anesthesia Transfer of Care Note  Patient: Joseph Bates  Procedure(s) Performed: ULTRASOUND, UPPER GI TRACT, ENDOSCOPIC EGD (ESOPHAGOGASTRODUODENOSCOPY)  Patient Location: PACU and Endoscopy Unit  Anesthesia Type:MAC  Level of Consciousness: awake, alert , oriented, and patient cooperative  Airway & Oxygen Therapy: Patient Spontanous Breathing and Patient connected to face mask oxygen  Post-op Assessment: Report given to RN and Post -op Vital signs reviewed and stable  Post vital signs: Reviewed and stable  Last Vitals:  Vitals Value Taken Time  BP 123/78 08/25/23 11:26  Temp    Pulse 58 08/25/23 11:27  Resp 18 08/25/23 11:27  SpO2 100 % 08/25/23 11:27  Vitals shown include unfiled device data.  Last Pain:  Vitals:   08/25/23 1032  TempSrc: Temporal  PainSc: 0-No pain      Patients Stated Pain Goal: 0 (08/25/23 1032)  Complications: No notable events documented.

## 2023-08-26 NOTE — Anesthesia Postprocedure Evaluation (Signed)
 Anesthesia Post Note  Patient: Joseph Bates  Procedure(s) Performed: ULTRASOUND, UPPER GI TRACT, ENDOSCOPIC EGD (ESOPHAGOGASTRODUODENOSCOPY)     Patient location during evaluation: Endoscopy Anesthesia Type: MAC Level of consciousness: awake Pain management: pain level controlled Vital Signs Assessment: post-procedure vital signs reviewed and stable Respiratory status: spontaneous breathing, nonlabored ventilation and respiratory function stable Cardiovascular status: blood pressure returned to baseline and stable Postop Assessment: no apparent nausea or vomiting Anesthetic complications: no   No notable events documented.  Last Vitals:  Vitals:   08/25/23 1130 08/25/23 1145  BP: 128/66 136/87  Pulse: (!) 58 (!) 51  Resp: 19 13  Temp:    SpO2: 100% 96%    Last Pain:  Vitals:   08/25/23 1145  TempSrc:   PainSc: 0-No pain                 Jamariah Tony P Zinnia Tindall

## 2023-08-28 ENCOUNTER — Encounter (HOSPITAL_COMMUNITY): Payer: Self-pay | Admitting: Gastroenterology

## 2023-10-02 DIAGNOSIS — K859 Acute pancreatitis without necrosis or infection, unspecified: Secondary | ICD-10-CM | POA: Diagnosis not present

## 2023-10-02 DIAGNOSIS — R978 Other abnormal tumor markers: Secondary | ICD-10-CM | POA: Diagnosis not present

## 2023-12-23 ENCOUNTER — Ambulatory Visit (INDEPENDENT_AMBULATORY_CARE_PROVIDER_SITE_OTHER): Admitting: Dermatology

## 2023-12-23 ENCOUNTER — Encounter: Payer: Self-pay | Admitting: Dermatology

## 2023-12-23 VITALS — BP 131/88 | HR 69

## 2023-12-23 DIAGNOSIS — D485 Neoplasm of uncertain behavior of skin: Secondary | ICD-10-CM | POA: Diagnosis not present

## 2023-12-23 DIAGNOSIS — L988 Other specified disorders of the skin and subcutaneous tissue: Secondary | ICD-10-CM | POA: Diagnosis not present

## 2023-12-23 DIAGNOSIS — L570 Actinic keratosis: Secondary | ICD-10-CM

## 2023-12-23 DIAGNOSIS — L821 Other seborrheic keratosis: Secondary | ICD-10-CM | POA: Diagnosis not present

## 2023-12-23 DIAGNOSIS — L858 Other specified epidermal thickening: Secondary | ICD-10-CM | POA: Diagnosis not present

## 2023-12-23 NOTE — Patient Instructions (Signed)

## 2023-12-23 NOTE — Progress Notes (Unsigned)
   New Patient Visit   Subjective  Joseph Bates is a 78 y.o. male who presents for the following: growths  Pt has a growth on his left clavicle for several years he'd like evaluated. He has never had skin cancer. Lesions are itchy and bothersome, not previously treated.   The following portions of the chart were reviewed this encounter and updated as appropriate: medications, allergies, medical history  Review of Systems:  No other skin or systemic complaints except as noted in HPI or Assessment and Plan.  Objective  Well appearing patient in no apparent distress; mood and affect are within normal limits.  A focused examination was performed of the following areas: Left  clavicle  Relevant exam findings are noted in the Assessment and Plan.  left shoulder 6 mm pink papule  right upper back 1 cm pink crusted plaque   Assessment & Plan   SEBORRHEIC KERATOSIS - Stuck-on, waxy, tan-brown papules and/or plaques  - Benign-appearing - Discussed benign etiology and prognosis. - Observe - Call for any changes  NEOPLASM OF UNCERTAIN BEHAVIOR OF SKIN (2) left shoulder Skin / nail biopsy Type of biopsy: tangential   Informed consent: discussed and consent obtained   Timeout: patient name, date of birth, surgical site, and procedure verified   Procedure prep:  Patient was prepped and draped in usual sterile fashion Prep type:  Chlorhexidine  Anesthesia: the lesion was anesthetized in a standard fashion   Anesthetic:  1% lidocaine  w/ epinephrine 1-100,000 buffered w/ 8.4% NaHCO3 Instrument used: DermaBlade   Hemostasis achieved with: aluminum chloride   Outcome: patient tolerated procedure with difficulty   Post-procedure details: sterile dressing applied and wound care instructions given    Specimen 1 - Surgical pathology Differential Diagnosis: R/O NMSC  Check Margins: No right upper back Skin / nail biopsy Type of biopsy: tangential   Informed consent: discussed and  consent obtained   Timeout: patient name, date of birth, surgical site, and procedure verified   Procedure prep:  Patient was prepped and draped in usual sterile fashion Prep type:  Isopropyl alcohol Anesthesia: the lesion was anesthetized in a standard fashion   Anesthetic:  1% lidocaine  w/ epinephrine 1-100,000 buffered w/ 8.4% NaHCO3 Instrument used: DermaBlade   Hemostasis achieved with: aluminum chloride   Outcome: patient tolerated procedure well   Post-procedure details: sterile dressing applied and wound care instructions given   Dressing type: bandage and pressure dressing    Specimen 2 - Surgical pathology Differential Diagnosis: R/O NMSC  Check Margins: No  Return for TBSE with brenda.  I, Darice Smock, CMA, am acting as scribe for RUFUS CHRISTELLA HOLY, MD.   Documentation: I have reviewed the above documentation for accuracy and completeness, and I agree with the above.  RUFUS CHRISTELLA HOLY, MD

## 2023-12-24 ENCOUNTER — Ambulatory Visit: Payer: Self-pay | Admitting: Dermatology

## 2023-12-24 LAB — SURGICAL PATHOLOGY

## 2023-12-29 ENCOUNTER — Ambulatory Visit: Admitting: Physician Assistant

## 2023-12-29 ENCOUNTER — Encounter: Payer: Self-pay | Admitting: Physician Assistant

## 2023-12-29 VITALS — BP 160/85 | HR 76

## 2023-12-29 DIAGNOSIS — Z1283 Encounter for screening for malignant neoplasm of skin: Secondary | ICD-10-CM

## 2023-12-29 DIAGNOSIS — L821 Other seborrheic keratosis: Secondary | ICD-10-CM | POA: Diagnosis not present

## 2023-12-29 DIAGNOSIS — L814 Other melanin hyperpigmentation: Secondary | ICD-10-CM

## 2023-12-29 DIAGNOSIS — W908XXA Exposure to other nonionizing radiation, initial encounter: Secondary | ICD-10-CM

## 2023-12-29 DIAGNOSIS — D229 Melanocytic nevi, unspecified: Secondary | ICD-10-CM

## 2023-12-29 DIAGNOSIS — L570 Actinic keratosis: Secondary | ICD-10-CM

## 2023-12-29 DIAGNOSIS — L578 Other skin changes due to chronic exposure to nonionizing radiation: Secondary | ICD-10-CM

## 2023-12-29 DIAGNOSIS — L309 Dermatitis, unspecified: Secondary | ICD-10-CM

## 2023-12-29 DIAGNOSIS — D1801 Hemangioma of skin and subcutaneous tissue: Secondary | ICD-10-CM

## 2023-12-29 MED ORDER — CLOBETASOL PROPIONATE 0.05 % EX SOLN: 1.0000 | Freq: Two times a day (BID) | CUTANEOUS | 2 refills | Status: DC

## 2023-12-29 MED ORDER — CLOBETASOL PROPIONATE 0.05 % EX SOLN: 1.0000 | Freq: Two times a day (BID) | CUTANEOUS | 2 refills | Status: AC

## 2023-12-29 NOTE — Patient Instructions (Signed)

## 2023-12-29 NOTE — Progress Notes (Signed)
   Total Body Skin Exam (TBSE) Visit   Subjective  Joseph Bates is a 78 y.o. male ESTABLISHED PATIENT who presents for the following: Skin Cancer Screening and Full Body Skin Exam.  Patient presents today for follow up visit for TBSE. Patient was last evaluated on 12/23/23 by Dr. Corey and underwent 2 biopsies. One lesion was benign and the other was an actinic keratosis and is here to have treatment with cryotherapy. Does admit to loving the sun and spends a great deal of time sun bathing.   He also is concerned about many years of having an itchy scalp.   The following portions of the chart were reviewed this encounter and updated as appropriate: medications, allergies, medical history  Review of Systems:  No other skin or systemic complaints except as noted in HPI or Assessment and Plan.  Objective  Well appearing patient in no apparent distress; mood and affect are within normal limits.  A full examination was performed including scalp, head, eyes, ears, nose, lips, neck, chest, axillae, abdomen, back, buttocks, bilateral upper extremities, bilateral lower extremities, hands, feet, fingers, toes, fingernails, and toenails. All findings within normal limits unless otherwise noted below.   Relevant physical exam findings are noted in the Assessment and Plan.    Assessment & Plan   LENTIGINES, SEBORRHEIC KERATOSES, HEMANGIOMAS - Benign normal skin lesions - Benign-appearing - Call for any changes  MELANOCYTIC NEVI - Tan-brown and/or pink-flesh-colored symmetric macules and papules - Benign appearing on exam today - Observation - Call clinic for new or changing moles - Recommend daily use of broad spectrum spf 30+ sunscreen to sun-exposed areas.   ACTINIC DAMAGE - Chronic condition, secondary to cumulative UV/sun exposure - diffuse scaly erythematous macules with underlying dyspigmentation - Recommend daily broad spectrum sunscreen SPF 30+ to sun-exposed areas, reapply every 2  hours as needed.  - Staying in the shade or wearing long sleeves, sun glasses (UVA+UVB protection) and wide brim hats (4-inch brim around the entire circumference of the hat) are also recommended for sun protection.  - Call for new or changing lesions.  SKIN CANCER SCREENING PERFORMED TODAY.  DERMATITIS UNSPECIFIED-SCALP   Exam: Erythema and scale posterior scalp   Treatment Plan: - start Pan-Oxyl BP wash to affected areas daily or every other day  - start clobetasol scalp solution 1-2 x daily, as needed.     AK (ACTINIC KERATOSIS) Right Upper Back Destruction of lesion - Right Upper Back Complexity: simple   Destruction method: cryotherapy   Informed consent: discussed and consent obtained   Timeout:  patient name, date of birth, surgical site, and procedure verified Lesion destroyed using liquid nitrogen: Yes   Region frozen until ice ball extended beyond lesion: Yes   Outcome: patient tolerated procedure well with no complications   Post-procedure details: wound care instructions given    ACTINIC SKIN DAMAGE   LENTIGINES   SEBORRHEIC KERATOSIS   CHERRY ANGIOMA   SCREENING EXAM FOR SKIN CANCER   DERMATITIS, UNSPECIFIED   Related Medications clobetasol (TEMOVATE) 0.05 % external solution Apply 1 Application topically 2 (two) times daily. MULTIPLE BENIGN NEVI   Return in about 1 year (around 12/28/2024) for TBSE follow up.  I, Doyce Pan, CMA, am acting as scribe for Shanena Pellegrino K, PA-C.   Documentation: I have reviewed the above documentation for accuracy and completeness, and I agree with the above.  Daziya Redmond K, PA-C

## 2025-01-02 ENCOUNTER — Ambulatory Visit: Admitting: Physician Assistant
